# Patient Record
Sex: Female | Born: 1955 | Race: White | Hispanic: No | Smoking: Current every day smoker
Health system: Southern US, Community
[De-identification: ages and names within clinical notes are randomized; demographics above are authoritative.]

## PROBLEM LIST (undated history)

## (undated) DIAGNOSIS — I10 Essential (primary) hypertension: Secondary | ICD-10-CM

## (undated) HISTORY — PX: SPINE SURGERY: SHX786

## (undated) HISTORY — PX: BREAST ENHANCEMENT SURGERY: SHX7

---

## 1999-05-22 ENCOUNTER — Other Ambulatory Visit: Admission: RE | Admit: 1999-05-22 | Discharge: 1999-05-22 | Payer: Self-pay | Admitting: Obstetrics and Gynecology

## 2000-05-04 ENCOUNTER — Other Ambulatory Visit: Admission: RE | Admit: 2000-05-04 | Discharge: 2000-05-04 | Payer: Self-pay | Admitting: Gynecology

## 2000-05-05 ENCOUNTER — Emergency Department (HOSPITAL_COMMUNITY): Admission: EM | Admit: 2000-05-05 | Discharge: 2000-05-05 | Payer: Self-pay | Admitting: Internal Medicine

## 2001-07-12 ENCOUNTER — Other Ambulatory Visit: Admission: RE | Admit: 2001-07-12 | Discharge: 2001-07-12 | Payer: Self-pay | Admitting: Obstetrics and Gynecology

## 2003-07-03 ENCOUNTER — Other Ambulatory Visit: Admission: RE | Admit: 2003-07-03 | Discharge: 2003-07-03 | Payer: Self-pay | Admitting: Obstetrics and Gynecology

## 2004-07-18 ENCOUNTER — Ambulatory Visit: Payer: Self-pay | Admitting: Pulmonary Disease

## 2004-07-18 ENCOUNTER — Ambulatory Visit (HOSPITAL_COMMUNITY): Admission: RE | Admit: 2004-07-18 | Discharge: 2004-07-18 | Payer: Self-pay | Admitting: Pulmonary Disease

## 2004-07-24 ENCOUNTER — Other Ambulatory Visit: Admission: RE | Admit: 2004-07-24 | Discharge: 2004-07-24 | Payer: Self-pay | Admitting: Obstetrics and Gynecology

## 2004-07-31 ENCOUNTER — Ambulatory Visit: Payer: Self-pay | Admitting: Cardiology

## 2004-08-01 ENCOUNTER — Ambulatory Visit (HOSPITAL_COMMUNITY): Admission: RE | Admit: 2004-08-01 | Discharge: 2004-08-01 | Payer: Self-pay | Admitting: Cardiology

## 2004-08-01 ENCOUNTER — Ambulatory Visit: Payer: Self-pay | Admitting: *Deleted

## 2004-08-07 ENCOUNTER — Ambulatory Visit: Payer: Self-pay

## 2004-08-13 ENCOUNTER — Ambulatory Visit: Payer: Self-pay | Admitting: *Deleted

## 2004-09-25 ENCOUNTER — Ambulatory Visit: Payer: Self-pay | Admitting: Cardiology

## 2004-09-29 ENCOUNTER — Ambulatory Visit: Payer: Self-pay | Admitting: Cardiology

## 2004-10-01 ENCOUNTER — Ambulatory Visit: Payer: Self-pay | Admitting: Cardiology

## 2004-10-01 ENCOUNTER — Ambulatory Visit: Payer: Self-pay

## 2004-10-03 ENCOUNTER — Ambulatory Visit: Payer: Self-pay

## 2004-10-15 ENCOUNTER — Ambulatory Visit: Payer: Self-pay | Admitting: Pulmonary Disease

## 2005-02-10 ENCOUNTER — Ambulatory Visit: Payer: Self-pay | Admitting: Cardiology

## 2005-08-04 ENCOUNTER — Other Ambulatory Visit: Admission: RE | Admit: 2005-08-04 | Discharge: 2005-08-04 | Payer: Self-pay | Admitting: Obstetrics and Gynecology

## 2005-08-21 ENCOUNTER — Ambulatory Visit (HOSPITAL_COMMUNITY): Admission: RE | Admit: 2005-08-21 | Discharge: 2005-08-21 | Payer: Self-pay | Admitting: Pulmonary Disease

## 2006-09-13 ENCOUNTER — Other Ambulatory Visit: Admission: RE | Admit: 2006-09-13 | Discharge: 2006-09-13 | Payer: Self-pay | Admitting: Obstetrics and Gynecology

## 2006-09-17 ENCOUNTER — Ambulatory Visit: Payer: Self-pay | Admitting: Family Medicine

## 2006-10-13 ENCOUNTER — Ambulatory Visit: Payer: Self-pay | Admitting: Pulmonary Disease

## 2006-10-13 LAB — CONVERTED CEMR LAB
ALT: 16 units/L (ref 0–40)
AST: 18 units/L (ref 0–37)
Albumin: 4.5 g/dL (ref 3.5–5.2)
Alkaline Phosphatase: 115 units/L (ref 39–117)
BUN: 8 mg/dL (ref 6–23)
Basophils Absolute: 0 10*3/uL (ref 0.0–0.1)
Basophils Relative: 0.5 % (ref 0.0–1.0)
Bilirubin Urine: NEGATIVE
Bilirubin, Direct: 0.1 mg/dL (ref 0.0–0.3)
CO2: 32 meq/L (ref 19–32)
Calcium: 9.7 mg/dL (ref 8.4–10.5)
Chloride: 104 meq/L (ref 96–112)
Cholesterol: 158 mg/dL (ref 0–200)
Creatinine, Ser: 0.8 mg/dL (ref 0.4–1.2)
Eosinophils Absolute: 0.1 10*3/uL (ref 0.0–0.6)
Eosinophils Relative: 1.4 % (ref 0.0–5.0)
GFR calc Af Amer: 98 mL/min
GFR calc non Af Amer: 81 mL/min
Glucose, Bld: 94 mg/dL (ref 70–99)
HCT: 42.9 % (ref 36.0–46.0)
HDL: 54.4 mg/dL (ref >39.0)
Hemoglobin, Urine: NEGATIVE
Hemoglobin: 14.5 g/dL (ref 12.0–15.0)
Ketones, ur: NEGATIVE mg/dL
LDL Cholesterol: 89 mg/dL (ref 0–99)
Leukocytes, UA: NEGATIVE
Lymphocytes Relative: 26.6 % (ref 12.0–46.0)
MCHC: 33.8 g/dL (ref 30.0–36.0)
MCV: 90.9 fL (ref 78.0–100.0)
Monocytes Absolute: 0.5 10*3/uL (ref 0.2–0.7)
Monocytes Relative: 5.6 % (ref 3.0–11.0)
Neutro Abs: 5.5 10*3/uL (ref 1.4–7.7)
Neutrophils Relative %: 65.9 % (ref 43.0–77.0)
Nitrite: NEGATIVE
Platelets: 255 10*3/uL (ref 150–400)
Potassium: 4.8 meq/L (ref 3.5–5.1)
RBC: 4.72 M/uL (ref 3.87–5.11)
RDW: 13.3 % (ref 11.5–14.6)
Sodium: 142 meq/L (ref 135–145)
Specific Gravity, Urine: 1.01 (ref 1.000–1.03)
TSH: 2.58 microintl units/mL (ref 0.35–5.50)
Total Bilirubin: 1 mg/dL (ref 0.3–1.2)
Total CHOL/HDL Ratio: 2.9
Total Protein, Urine: NEGATIVE mg/dL
Total Protein: 7.5 g/dL (ref 6.0–8.3)
Triglycerides: 73 mg/dL (ref 0–149)
Urine Glucose: NEGATIVE mg/dL
Urobilinogen, UA: 0.2 (ref 0.0–1.0)
VLDL: 15 mg/dL (ref 0–40)
Vit D, 1,25-Dihydroxy: 44 (ref 20–57)
WBC: 8.3 10*3/uL (ref 4.5–10.5)
pH: 7 (ref 5.0–8.0)

## 2007-01-26 ENCOUNTER — Ambulatory Visit: Payer: Self-pay | Admitting: Pulmonary Disease

## 2007-01-27 ENCOUNTER — Ambulatory Visit: Payer: Self-pay | Admitting: Cardiovascular Disease

## 2007-01-27 ENCOUNTER — Ambulatory Visit: Payer: Self-pay | Admitting: Pulmonary Disease

## 2007-01-27 LAB — CONVERTED CEMR LAB
BUN: 15 mg/dL (ref 6–23)
Basophils Absolute: 0 10*3/uL (ref 0.0–0.1)
Basophils Relative: 0.5 % (ref 0.0–1.0)
CO2: 28 meq/L (ref 19–32)
Calcium: 9.2 mg/dL (ref 8.4–10.5)
Chloride: 105 meq/L (ref 96–112)
Creatinine, Ser: 0.9 mg/dL (ref 0.4–1.2)
Eosinophils Absolute: 0.2 10*3/uL (ref 0.0–0.6)
Eosinophils Relative: 2.4 % (ref 0.0–5.0)
GFR calc Af Amer: 86 mL/min
GFR calc non Af Amer: 71 mL/min
Glucose, Bld: 91 mg/dL (ref 70–99)
HCT: 40.6 % (ref 36.0–46.0)
Hemoglobin: 14.2 g/dL (ref 12.0–15.0)
Lymphocytes Relative: 29 % (ref 12.0–46.0)
MCHC: 34.9 g/dL (ref 30.0–36.0)
MCV: 91.5 fL (ref 78.0–100.0)
Monocytes Absolute: 0.5 10*3/uL (ref 0.2–0.7)
Monocytes Relative: 6.4 % (ref 3.0–11.0)
Neutro Abs: 5.3 10*3/uL (ref 1.4–7.7)
Neutrophils Relative %: 61.7 % (ref 43.0–77.0)
Platelets: 220 10*3/uL (ref 150–400)
Potassium: 3.8 meq/L (ref 3.5–5.1)
RBC: 4.44 M/uL (ref 3.87–5.11)
RDW: 13.6 % (ref 11.5–14.6)
Sed Rate: 5 mm/hr (ref 0–25)
Sodium: 139 meq/L (ref 135–145)
WBC: 8.4 10*3/uL (ref 4.5–10.5)

## 2007-07-13 ENCOUNTER — Ambulatory Visit: Payer: Self-pay | Admitting: Pulmonary Disease

## 2007-08-12 ENCOUNTER — Ambulatory Visit: Payer: Self-pay | Admitting: Pulmonary Disease

## 2007-08-12 DIAGNOSIS — F411 Generalized anxiety disorder: Secondary | ICD-10-CM | POA: Insufficient documentation

## 2007-08-12 DIAGNOSIS — F329 Major depressive disorder, single episode, unspecified: Secondary | ICD-10-CM | POA: Insufficient documentation

## 2007-08-12 DIAGNOSIS — M1990 Unspecified osteoarthritis, unspecified site: Secondary | ICD-10-CM | POA: Insufficient documentation

## 2007-08-12 DIAGNOSIS — M810 Age-related osteoporosis without current pathological fracture: Secondary | ICD-10-CM | POA: Insufficient documentation

## 2007-08-13 DIAGNOSIS — K649 Unspecified hemorrhoids: Secondary | ICD-10-CM | POA: Insufficient documentation

## 2008-09-12 ENCOUNTER — Ambulatory Visit: Payer: Self-pay | Admitting: Pulmonary Disease

## 2008-09-12 DIAGNOSIS — R51 Headache: Secondary | ICD-10-CM | POA: Insufficient documentation

## 2008-09-12 DIAGNOSIS — K219 Gastro-esophageal reflux disease without esophagitis: Secondary | ICD-10-CM | POA: Insufficient documentation

## 2008-09-12 DIAGNOSIS — R519 Headache, unspecified: Secondary | ICD-10-CM | POA: Insufficient documentation

## 2008-09-13 DIAGNOSIS — R030 Elevated blood-pressure reading, without diagnosis of hypertension: Secondary | ICD-10-CM | POA: Insufficient documentation

## 2008-09-13 LAB — CONVERTED CEMR LAB
ALT: 21 units/L (ref 0–35)
AST: 21 units/L (ref 0–37)
Albumin: 4.2 g/dL (ref 3.5–5.2)
Alkaline Phosphatase: 103 units/L (ref 39–117)
BUN: 9 mg/dL (ref 6–23)
Basophils Absolute: 0.1 10*3/uL (ref 0.0–0.1)
Basophils Relative: 0.7 % (ref 0.0–3.0)
Bilirubin, Direct: 0.1 mg/dL (ref 0.0–0.3)
CO2: 29 meq/L (ref 19–32)
Calcium: 9 mg/dL (ref 8.4–10.5)
Chloride: 107 meq/L (ref 96–112)
Creatinine, Ser: 0.8 mg/dL (ref 0.4–1.2)
Eosinophils Absolute: 0.1 10*3/uL (ref 0.0–0.7)
Eosinophils Relative: 1.2 % (ref 0.0–5.0)
GFR calc Af Amer: 98 mL/min
GFR calc non Af Amer: 81 mL/min
Glucose, Bld: 97 mg/dL (ref 70–99)
HCT: 40.9 % (ref 36.0–46.0)
Hemoglobin: 14.2 g/dL (ref 12.0–15.0)
Lymphocytes Relative: 25.1 % (ref 12.0–46.0)
MCHC: 34.7 g/dL (ref 30.0–36.0)
MCV: 92.4 fL (ref 78.0–100.0)
Monocytes Absolute: 0.5 10*3/uL (ref 0.1–1.0)
Monocytes Relative: 5.7 % (ref 3.0–12.0)
Neutro Abs: 5.2 10*3/uL (ref 1.4–7.7)
Neutrophils Relative %: 67.3 % (ref 43.0–77.0)
Platelets: 196 10*3/uL (ref 150–400)
Potassium: 3.7 meq/L (ref 3.5–5.1)
RBC: 4.43 M/uL (ref 3.87–5.11)
RDW: 13.1 % (ref 11.5–14.6)
Sed Rate: 8 mm/hr (ref 0–22)
Sodium: 141 meq/L (ref 135–145)
TSH: 2.46 microintl units/mL (ref 0.35–5.50)
Total Bilirubin: 0.8 mg/dL (ref 0.3–1.2)
Total Protein: 6.8 g/dL (ref 6.0–8.3)
WBC: 7.9 10*3/uL (ref 4.5–10.5)

## 2009-03-12 ENCOUNTER — Encounter: Payer: Self-pay | Admitting: Gastroenterology

## 2009-03-12 LAB — CONVERTED CEMR LAB
Basophils Absolute: 0 10*3/uL (ref 0.0–0.1)
Basophils Relative: 0.6 % (ref 0.0–3.0)
Eosinophils Absolute: 0.2 10*3/uL (ref 0.0–0.7)
Eosinophils Relative: 2.5 % (ref 0.0–5.0)
HCT: 38.1 % (ref 36.0–46.0)
Hemoglobin: 13.4 g/dL (ref 12.0–15.0)
Lymphocytes Relative: 28 % (ref 12.0–46.0)
Lymphs Abs: 2 10*3/uL (ref 0.7–4.0)
MCHC: 35.3 g/dL (ref 30.0–36.0)
MCV: 88.9 fL (ref 78.0–100.0)
Monocytes Absolute: 0.5 10*3/uL (ref 0.1–1.0)
Monocytes Relative: 7.3 % (ref 3.0–12.0)
Neutro Abs: 4.4 10*3/uL (ref 1.4–7.7)
Neutrophils Relative %: 61.6 % (ref 43.0–77.0)
Platelets: 322 10*3/uL (ref 150.0–400.0)
RBC: 4.29 M/uL (ref 3.87–5.11)
RDW: 12.3 % (ref 11.5–14.6)
WBC: 7.1 10*3/uL (ref 4.5–10.5)

## 2009-03-14 ENCOUNTER — Encounter: Payer: Self-pay | Admitting: Gastroenterology

## 2009-03-14 LAB — CONVERTED CEMR LAB
Basophils Absolute: 0.1 K/uL
Basophils Relative: 1.5 %
Eosinophils Absolute: 0.3 K/uL
Eosinophils Relative: 5.2 % — ABNORMAL HIGH
HCT: 39.1 %
Hemoglobin: 13.5 g/dL
Lymphocytes Relative: 41.4 %
Lymphs Abs: 2.1 K/uL
MCHC: 34.5 g/dL
MCV: 93.3 fL
Monocytes Absolute: 0.5 K/uL
Monocytes Relative: 9.4 %
Neutro Abs: 2.2 K/uL
Neutrophils Relative %: 42.5 % — ABNORMAL LOW
Platelets: 238 K/uL
RBC: 4.19 M/uL
RDW: 12.5 %
WBC: 5.2 10*3/microliter

## 2009-05-06 ENCOUNTER — Ambulatory Visit: Payer: Self-pay | Admitting: Pulmonary Disease

## 2009-05-06 ENCOUNTER — Encounter: Payer: Self-pay | Admitting: Adult Health

## 2009-05-07 ENCOUNTER — Encounter: Payer: Self-pay | Admitting: Pulmonary Disease

## 2009-05-11 ENCOUNTER — Ambulatory Visit (HOSPITAL_COMMUNITY): Admission: RE | Admit: 2009-05-11 | Discharge: 2009-05-11 | Payer: Self-pay | Admitting: Specialist

## 2009-05-21 ENCOUNTER — Encounter: Payer: Self-pay | Admitting: Pulmonary Disease

## 2009-05-22 ENCOUNTER — Ambulatory Visit (HOSPITAL_COMMUNITY): Admission: RE | Admit: 2009-05-22 | Discharge: 2009-05-23 | Payer: Self-pay | Admitting: Specialist

## 2009-09-19 ENCOUNTER — Ambulatory Visit: Payer: Self-pay | Admitting: Pulmonary Disease

## 2009-09-19 ENCOUNTER — Telehealth: Payer: Self-pay | Admitting: Pulmonary Disease

## 2009-09-19 DIAGNOSIS — F17200 Nicotine dependence, unspecified, uncomplicated: Secondary | ICD-10-CM | POA: Insufficient documentation

## 2009-09-19 DIAGNOSIS — J441 Chronic obstructive pulmonary disease with (acute) exacerbation: Secondary | ICD-10-CM | POA: Insufficient documentation

## 2009-09-19 DIAGNOSIS — M545 Low back pain, unspecified: Secondary | ICD-10-CM | POA: Insufficient documentation

## 2009-10-14 ENCOUNTER — Ambulatory Visit: Payer: Self-pay | Admitting: Pulmonary Disease

## 2009-10-18 ENCOUNTER — Telehealth (INDEPENDENT_AMBULATORY_CARE_PROVIDER_SITE_OTHER): Payer: Self-pay | Admitting: *Deleted

## 2010-09-25 NOTE — Assessment & Plan Note (Signed)
Summary: 3 week follow up with spiro/la   CC:  3 week ROV....  History of Present Illness: [name] here for an add-on appt due to cough & dyspnea...   ~  Jan11:   I have not seen her in several years, she works in the [employer]... presents w/  ~1week hx cough- dry, no sputum but congested, w/ wheezing & SOB... she is a 1ppd smoker... denies f/ c/ s, but has had some CP- mostly due to the coughing she says... also notes some nasal/ head congestion & drainage... exam w/ bilat L>R rhonchi/ wheezing; & CXR w/ COPD & faint LLL opacity... we discussed Rx w/ Zithromax, Prednisone, Mucinex + Fluids, & cough syrup Prn... plan short term f/u to consider AB regimen & smoking cessation counselling...   ~  October 14, 2009:  She is improved- cough, discomfort, dyspnea- all resolved... exam now clear;  PFT w/ FEV1= 1.53 (53%) but similar to 2008... she is still smoking & can't vs won't quit... we discussed adding Symbicort 80- 2spBid regularly & she knows that she needs to quit entirely...    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  ~  Hx resp failure, ARDS- 1998 w/ staph pneumonia ([place])...  ~  PFTs 2/08 showed FVC= 2.67, FEV1= 1.52, %1sec= 57, mid-flows= 27%... rec to start Spiriva, & stop smoking- but she didn't do either...  ~  CT Angio Chest 6/08 showed no PE, mod centrilob emphysema, ?collateral vessels about the left breast suggesting a component of central venous insuffic at the left subclav vein...  ~  1/11: presents w/ infectious exac req Zithromax, Prednisone, Mucinex, Fluids, etc> resolved.  ~  2/11: PFT's showed FVC=2.22 (61%), FEV1=1.53 (53%), %1sec=69, mid-flows=35%pred... we decided to start SYMBICORT 80-2spBid regularly as she had a reaction to Spiriva in past.  CIGARETTE SMOKER (ICD-305.1) - smokes 1ppd by her hx and can't vs won't quit... we discuused  smoking cessation strategies but she is not interested...  ELEVATED BLOOD PRESSURE (ICD-796.2) - hx transient elevated BP due to stress... advised stress reduction, low sodium, Ativan Prn...   ~  Cardiolite 5/03 showed no ischemia, no infarct, EF= 58%, +breast attenuation...  ~  Cath by DrStuckey in 2005 showed min CAD- no signif CAD, ?spasm...  ~  2DEcho 2/06 showed norm LV wall thickness, norm LVF, norm wall motion...  ~  2/11:  BP today = 116/82 & feeling OK...  GERD (ICD-530.81) - prev on Nexium, now using PRILOSEC 20mg  Prn OTC...  HEMORRHOIDS (ICD-455.6)  OSTEOARTHRITIS (ICD-715.90) - she uses Prn OTC NSAIDs...  BACK PAIN, LUMBAR (ICD-724.2) - eval 9/10 by DrBeane w/ XRays & MRI showing disk herniation compressing the L5 S1 nerve roots... she had an L4-5 decompression w/ foraminotomy & microdiskectomy 9/10 by drBeane... improved post op but some neuropathic symptoms treated w/ LYRICA 50mg  Qhs...  OSTEOPOROSIS (ICD-733.00) - followed by DrFontaine et al... prev on FOSAMAX, Calcium, MVI...  ~  BMD 2008 showed TScores -2.0 in Spine, & -3.4 in left FemNeck (worse than 2001)...  ~  labs 2/08 showed Vit D level = 44  HEADACHE (ICD-784.0)  ANXIETY (ICD-300.00) - given Ativan by DrBeane perioperatively to help w/ smoking cessation... she tried Xanax in the past but didn't like how it made her feel...  DEPRESSION (ICD-311) - tried Cymbalta in the past but didn't like how it made her feel...   Allergies (  verified): No Known Drug Allergies  Comments:  Nurse/Medical Assistant: The patient's medications and allergies were reviewed with the patient and were updated in the Medication and Allergy Lists.  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 emphysema - father heart disease - mother, father (CABG) rheumatism - father hypercholesterolemia - mother, father  Social History: Reviewed history from 05/06/2009 and no changes required. current smoker, 1ppd x41yrs. 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  The patient denies anorexia, fever, weight loss, weight gain, vision loss, decreased hearing, hoarseness, chest pain, syncope, peripheral edema, prolonged cough, headaches, hemoptysis, abdominal pain, melena, hematochezia, severe indigestion/heartburn, hematuria, incontinence, muscle weakness, suspicious skin lesions, transient blindness, difficulty walking, depression, unusual weight change, abnormal bleeding, enlarged lymph nodes, and angioedema.    Vital Signs:  Patient profile:   55 year old female Height:      65 inches Weight:      130 pounds O2 Sat:      99 % on Room air Temp:     97.3 degrees F oral Pulse rate:   73 / minute BP sitting:   116 / 82  (right arm) Cuff size:   regular  Vitals Entered By: Randell Loop CMA (October 14, 2009 10:32 AM)  O2 Sat at Rest %:  99 O2 Flow:  Room air CC: 3 week ROV... Is Patient Diabetic? No Pain Assessment Patient in pain? no      Comments meds updated today   Physical Exam  Additional Exam:  WD, Thin, [name] in NAD... GENERAL:  Alert & oriented; pleasant & cooperative... HEENT:  [place]/AT, EOM-wnl, PERRLA, EACs-clear, TMs-wnl, NOSE-clear, THROAT-clear & wnl. NECK:  Supple w/ fairROM; no JVD; normal carotid impulses w/o bruits; no thyromegaly or nodules palpated; no lymphadenopathy. CHEST:  Chest  clear now w/o wheezies, rales or signs of consolidation... HEART:  Regular Rhythm; without murmurs/ rubs/ or gallops heard...  ABDOMEN:  Soft & nontender; normal bowel sounds; no organomegaly or masses detected. EXT: without deformities, mild arthritic changes; no varicose veins/ venous insuffic/ or edema. NEURO:  CN's intact; motor testing normal; sensory testing normal; gait normal & balance OK. DERM:  No lesions noted; no rash etc...    Pulmonary Function Test Date: 10/14/2009 10:48 AM Gender: Female  Pre-Spirometry FVC    Value: 2.22 L/min   Pred: 3.64 L/min     % Pred: 61 % FEV1    Value: 1.53 L     Pred: 2.87 L     % Pred: 53 % FEV1/FVC  Value: 69 %     Pred: 80 %     % Pred: -- % FEF 25-75  Value: 0.98 L/min   Pred: 2.78 L/min     % Pred: 35 %  Comments: Tracings are fair- there is mild to mod airflow obstruction... cannot r/o superimposed restriction w/o lung volume measurement...  SN  Impression & Recommendations:  Problem # 1:  LLL PNEUMONIA>>> Clinically resolved & back toher baseline...  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 reviewed PFT w/ pt> rec start SYMBICORT 80- 2spBid... Orders: Spirometry w/Graph (94010)  Problem # 3:  CIGARETTE SMOKER (ICD-305.1) We discussed smoking cessation strategies including cessation programs, counselling, nicotine replacement, and Chantix receptor blockade... the pt is not interested at this time but we left the door open should she like to reconsider at any time.   Problem # 4:  OTHER MEDICAL PROBLEMS AS NOTED>>>  Complete Medication List: 1)  Mucinex Maximum Strength 1200 Mg Xr12h-tab (Guaifenesin) .... Take 1 tab by mouth two times a day w/ fluids.Marland KitchenMarland Kitchen 2)  Aspirin 81 Mg Tbec (Aspirin) .... Take 1 by mouth once daily 3)  Nexium 40 Mg Cpdr (Esomeprazole magnesium) .... Take one tablet by mouth once daily 4)  Ibuprofen 200 Mg Tabs (Ibuprofen) .... As needed 5)   Lyrica 50 Mg Caps (Pregabalin) .... Take one tablet by mouth at bedtime 6)  Promethazine-codeine 6.25-10 Mg/77ml Syrp (Promethazine-codeine) .Marland Kitchen.. 1 tsp by mouth every 4-6h as needed for cough... 7)  Symbicort 80-4.5 Mcg/act Aero (Budesonide-formoterol fumarate) .... 2 inhalations two times a day  Other Orders: Prescription Created Electronically [phone])  Patient Instructions: 1)  Today we updated your med list- see below.... 2)  We added SYMBICORT 2 inhalations two times a day for your COPD.Marland KitchenMarland Kitchen 3)  Miranda [name]- you must quit smoking!!! Let me know if you want to try the Chantix Rx.Marland KitchenMarland Kitchen 4)  Call for any problems.Marland KitchenMarland Kitchen 5)  Let's plan a routine follow up in 1 yr, sooner as needed. 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

## 2010-09-25 NOTE — Assessment & Plan Note (Signed)
Summary: SOB, congestion, not seen since 2008///JJ   CC:  Add-on appt for cough and & dyspnea....  History of Present Illness: [name] here for an add-on appt due to cough & dyspnea... I have not seen her in several years, she works in the [employer]... presents w/  ~1week hx cough- dry, no sputum but congested, w/ wheezing & SOB... she is a 1ppd smoker... denies f/ c/ s, but has had some CP- mostly due to the coughing she says... also notes some nasal/ head congestion & drainage...       ** Eval shows exam w/ bilat L>R rhonchi/ wheezing; & CXR w/ COPD & faint LLL opacity... we discussed Rx w/ Zithromax, Prednisone, Mucinex + Fluids, & cough syrup Prn... plan short term f/u to consider AB regimen & smoking cessation counselling...   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  ~  PFTs 2/08 showed FVC= 2.67, FEV1= 1.52, %1sec= 57, mid-flows= 27%... rec to start Spiriva, & stop smoking- but she didn't do either...  ~  CT [place] Chest 6/08 showed no PE, mod centrilob emphysema, ?collateral vessels about the left breast suggesting a component of central venous insuffic at the left subclav vein...  ~  1/11: presents w/ infectious exac req Zithromax, Prednisone, Mucinex, Fluids, etc...  CIGARETTE SMOKER (ICD-305.1) - smokes 1ppd by her hx and can't vs won't quit...  ELEVATED BLOOD PRESSURE (ICD-796.2) - hx transient elevated BP due to stress... advised stress reduction, low sodium, Ativan Prn...  ~  Cardiolite 5/03 showed no ischemia, no infarct, EF= 58%, +breast attenuation...  ~  Cath by DrStuckey in 2005 showed min CAD- no signif CAD, ?spasm...  ~  2DEcho 2/06 showed norm LV wall thickness, norm LVF, norm wall motion...  GERD (ICD-530.81) - prev on Nexium, now using PRILOSEC 20mg  Prn OTC...  HEMORRHOIDS (ICD-455.6)  OSTEOARTHRITIS (ICD-715.90) - she uses Prn OTC NSAIDs...  BACK  PAIN, LUMBAR (ICD-724.2) - eval 9/10 by DrBeane w/ XRays & MRI showing disk herniation compressing the L5 S1 nerve roots... she had an L4-5 decompression w/ foraminotomy & microdiskectomy 9/10 by drBeane... improved post op but some neuropathic symptoms treated w/ LYRICA 50mg  Qhs...  OSTEOPOROSIS (ICD-733.00) - followed by DrFontaine et al... prev on FOSAMAX, Calcium, MVI...  ~  BMD 2008 showed TScores -2.0 in Spine, & -3.4 in left FemNeck (worse than 2001)...  ~  labs 2/08 showed Vit D level = 44  HEADACHE (ICD-784.0)  ANXIETY (ICD-300.00) - given Ativan by DrBeane perioperatively to help w/ smoking cessation... she tried Xanax in the past but didn't like how it made her feel...  DEPRESSION (ICD-311) - tried Cymbalta in the past but didn't like how it made her feel...    Allergies (verified): No Known Drug Allergies  Comments:  Nurse/Medical Assistant: The patient's medications and allergies were reviewed with the patient and were updated in the Medication and Allergy Lists.  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 emphysema - father heart disease - mother, father (CABG) rheumatism - father hypercholesterolemia - mother, father  Social History: Reviewed history from 05/06/2009 and no changes  required. current smoker, 1ppd x81yrs. 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  The patient denies anorexia, fever, weight loss, weight gain, vision loss, decreased hearing, chest pain, syncope, peripheral edema, headaches, hemoptysis, abdominal pain, melena, hematochezia, severe indigestion/heartburn, hematuria, incontinence, muscle weakness, suspicious skin lesions, transient blindness, difficulty walking, depression, unusual weight change, abnormal bleeding, enlarged lymph nodes, and angioedema.    Vital Signs:  Patient profile:   55 year old female Height:      65 inches Weight:      129.13 pounds BMI:     21.57 O2 Sat:      95 % on Room air Temp:     97.6 degrees F oral Pulse rate:   94 / minute BP sitting:   120 / 90  (left arm) Cuff size:   regular  Vitals Entered By: Randell Loop CMA (September 19, 2009 11:17 AM)  O2 Sat at Rest %:  95 O2 Flow:  Room air CC: Add-on appt for cough, & dyspnea... Comments meds updated today   Physical Exam  Additional Exam:  WD, Thin, [name] in NAD... GENERAL:  Alert & oriented; pleasant & cooperative... HEENT:  [place]/AT, EOM-wnl, PERRLA, EACs-clear, TMs-wnl, NOSE-clear, THROAT-clear & wnl. NECK:  Supple w/ fairROM; no JVD; normal carotid impulses w/o bruits; no thyromegaly or nodules palpated; no lymphadenopathy. CHEST:  Scat rhonchi w/ exp wheezing, no rales or signs of consolidation... HEART:  Regular Rhythm; without murmurs/ rubs/ or gallops heard...  ABDOMEN:  Soft & nontender; normal bowel sounds; no organomegaly or masses detected. EXT: without deformities, mild arthritic changes; no varicose veins/ venous insuffic/ or edema. NEURO:  CN's intact; motor testing normal; sensory testing normal; gait normal & balance OK. DERM:  No lesions noted; no rash etc...     CXR  Procedure date:  09/19/2009  Findings:      CHEST - 2 VIEW   Comparison: 05/22/2009   Findings: Bilateral breast implants. Midline trachea.  Normal heart size and mediastinal contours. No pleural  effusion or pneumothorax. Basilar segment left lower lobe airspace disease.   IMPRESSION:   1.  Left lower lobe airspace disease.  Although this could represent atelectasis, given the clinical history, is suspicious for infection.  Recommend radiographic follow-up until clearing. 2.  Underlying COPD/chronic bronchitis.   Read By:  Consuello Bossier,  M.D.      Comments:      I have personally reviewed this XRay on the [employer] system- SN    CXR  Procedure date:  09/19/2009  Findings:      CHEST - 2 VIEW   Comparison: 05/22/2009   Findings: Bilateral breast implants. Midline trachea.  Normal heart size and mediastinal contours. No pleural effusion or pneumothorax. Basilar segment left lower lobe airspace disease.   IMPRESSION:   1.  Left lower lobe airspace disease.  Although this could represent atelectasis, given the clinical history, is suspicious for infection.  Recommend radiographic follow-up until clearing. 2.  Underlying COPD/chronic bronchitis.  Read By:  Consuello Bossier,  M.D.       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 must quit smoking- Rx w/ ZPak,Pred, Mucinex, etc... Orders: Depo- Medrol 80mg  (  J1040) Admin of Therapeutic Inj  intramuscular or subcutaneous (57322) T-2 View CXR (71020TC)  Problem # 2:  ELEVATED BLOOD PRESSURE (ICD-796.2) Discussed no salt etc...  Problem # 3:  GERD (ICD-530.81) GI stable- continue meds... Her updated medication list for this problem includes:    Nexium 40 Mg Cpdr (Esomeprazole magnesium) .Marland Kitchen... Take one tablet by mouth once daily  Problem # 4:  OSTEOPOROSIS (ICD-733.00) She has signif Osteoporosis... will discuss options on ret OV...  Problem # 5:  OTHER MEDICAL PROBLEMS AS NOTED>>>  Complete Medication List: 1)  Aspirin 81 Mg Tbec (Aspirin) .... Take 1 by mouth once daily 2)  Ibuprofen 200 Mg Tabs (Ibuprofen) .... As  needed 3)  Lyrica 50 Mg Caps (Pregabalin) .... Take one tablet by mouth at bedtime 4)  Zithromax Z-pak 250 Mg Tabs (Azithromycin) .... Take as directed... 5)  Prednisone (pak) 10 Mg Tabs (Prednisone) .... Take as directed... 6)  Mucinex Maximum Strength 1200 Mg Xr12h-tab (Guaifenesin) .... Take 1 tab by mouth two times a day w/ fluids.Marland KitchenMarland Kitchen 7)  Promethazine-codeine 6.25-10 Mg/35ml Syrp (Promethazine-codeine) .Marland Kitchen.. 1 tsp by mouth every 4-6h as needed for cough... 8)  Nexium 40 Mg Cpdr (Esomeprazole magnesium) .... Take one tablet by mouth once daily  Other Orders: Prescription Created Electronically [phone])  Patient Instructions: 1)  Today we updated your med list- see below.... 2)  For your Asthmatic Bronchitis:  1) you must cut back & quit smoking...  2) take the ZPak antibiotic.Marland KitchenMarland Kitchen  3) we gave you a Depo shot & started a Prednisone dosepak for 1 week... 4) start the MUCINEX MAX OTC- 1,200mg  two times a day w/ fluids.Marland KitchenMarland Kitchen  4) you may use the cough syrup as needed... 3)  Today we did a follow up CXR-  check the phone tree for your results.Marland KitchenMarland Kitchen 4)  Call for any worsening symptoms.., 5)  Let's plan a follow up visit in about 3 weeks to check your PFT's and consider any longer term meds that can help... Prescriptions: PROMETHAZINE-CODEINE 6.25-10 MG/5ML SYRP (PROMETHAZINE-CODEINE) 1 tsp by mouth every 4-6H as needed for cough...  #8 oz x 1   Entered and Authorized by:   Michele Mcalpine MD   Signed by:   Michele Mcalpine MD on 09/19/2009   Method used:   Print then Give to Patient   RxID:   [id number] PREDNISONE (PAK) 10 MG TABS (PREDNISONE) take as directed...  #1 pack x 0   Entered and Authorized by:   Michele Mcalpine MD   Signed by:   Michele Mcalpine MD on 09/19/2009   Method used:   Print then Give to Patient   RxID:   [id number] ZITHROMAX Z-PAK 250 MG TABS (AZITHROMYCIN) take as directed...  #1 pack x 0   Entered and Authorized by:   Michele Mcalpine MD   Signed by:   Michele Mcalpine MD on  09/19/2009   Method used:   Print then Give to Patient   RxID:   [id number]    Immunization History:  Influenza Immunization History:    Influenza:  historical (06/04/2009)    Medication Administration  Injection # 1:    Medication: Depo- Medrol 80mg     Diagnosis: OBSTRUCTIVE CHRONIC BRONCHITIS WITH EXACERBATION (ICD-491.21)    Route: IM    Site: RUOQ gluteus    Exp Date: 05-2010    Lot #: [id number]    Mfr: teva    Patient tolerated injection without complications    Given by: Randell Loop  CMA (September 19, 2009 12:20 PM)  Orders Added: 1)  Prescription Created Electronically [[id number]] 2)  Est. Patient Level IV [81191] 3)  Depo- Medrol 80mg  [J1040] 4)  Admin of Therapeutic Inj  intramuscular or subcutaneous [96372] 5)  T-2 View CXR [71020TC]

## 2010-09-25 NOTE — Progress Notes (Signed)
Summary: lyrica samples  Phone Note Call from Patient Call back at Work Phone [phone]   Caller: Patient Call For: Dr. Kriste Basque Summary of Call: pt request samples of lyrica.  however, we only have samples of the 75mg .  pt would like to know if she may samples of this strength.  please advise, thanks! Initial call taken by: Boone Master CNA,  October 18, 2009 11:35 AM  Follow-up for Phone Call        pt given samples of the lyrica 75mg   ok per SN Randell Loop CMA  October 18, 2009 12:13 PM

## 2010-09-25 NOTE — Progress Notes (Signed)
Summary: rx  Phone Note From Pharmacy Call back at [phone]   Caller: [facility] Call For: Tammy P  Summary of Call: Prednisone, need # days supply. Initial call taken by: Eugene Gavia,  September 19, 2009 1:29 PM  Follow-up for Phone Call        per SN pt needs 6 day pak. pharmacy notified. Carron Curie CMA  September 19, 2009 2:05 PM

## 2010-11-28 LAB — PROTIME-INR
INR: 1 (ref 0.00–1.49)
Prothrombin Time: 12.9 seconds (ref 11.6–15.2)

## 2010-11-28 LAB — CBC
HCT: 44.5 % (ref 36.0–46.0)
Hemoglobin: 15.2 g/dL — ABNORMAL HIGH (ref 12.0–15.0)
MCHC: 34.1 g/dL (ref 30.0–36.0)
MCV: 92.6 fL (ref 78.0–100.0)
Platelets: 228 10*3/uL (ref 150–400)
RBC: 4.81 MIL/uL (ref 3.87–5.11)
RDW: 14.2 % (ref 11.5–15.5)
WBC: 8.5 10*3/uL (ref 4.0–10.5)

## 2010-11-28 LAB — URINE MICROSCOPIC-ADD ON

## 2010-11-28 LAB — URINALYSIS, ROUTINE W REFLEX MICROSCOPIC
Bilirubin Urine: NEGATIVE
Glucose, UA: NEGATIVE mg/dL
Ketones, ur: NEGATIVE mg/dL
Leukocytes, UA: NEGATIVE
Nitrite: NEGATIVE
Protein, ur: NEGATIVE mg/dL
Specific Gravity, Urine: 1.008 (ref 1.005–1.030)
Urobilinogen, UA: 0.2 mg/dL (ref 0.0–1.0)
pH: 7 (ref 5.0–8.0)

## 2010-11-28 LAB — COMPREHENSIVE METABOLIC PANEL
ALT: 16 U/L (ref 0–35)
AST: 21 U/L (ref 0–37)
Albumin: 5 g/dL (ref 3.5–5.2)
Alkaline Phosphatase: 102 U/L (ref 39–117)
BUN: 11 mg/dL (ref 6–23)
CO2: 26 mEq/L (ref 19–32)
Calcium: 9.7 mg/dL (ref 8.4–10.5)
Chloride: 106 mEq/L (ref 96–112)
Creatinine, Ser: 0.96 mg/dL (ref 0.4–1.2)
GFR calc Af Amer: >60 mL/min (ref >60)
GFR calc non Af Amer: >60 mL/min (ref >60)
Glucose, Bld: 97 mg/dL (ref 70–99)
Potassium: 4.1 mEq/L (ref 3.5–5.1)
Sodium: 140 mEq/L (ref 135–145)
Total Bilirubin: 1 mg/dL (ref 0.3–1.2)
Total Protein: 7.6 g/dL (ref 6.0–8.3)

## 2010-11-28 LAB — APTT: aPTT: 30 seconds (ref 24–37)

## 2011-01-06 NOTE — Assessment & Plan Note (Signed)
[name]                             PULMONARY OFFICE NOTE   Miranda [name], Miranda [name]                  MRN:          [id number]  DATE:07/13/2007                            DOB:          [birth year]    HISTORY OF PRESENT ILLNESS:  Patient is a very pleasant 55 year old  white female patient of Dr. Kriste Basque, who has a known history of  osteoarthritis and osteoporosis.  Patient presents today, complaining  that, over the last several months, she has had increased stress, felt  very agitated at times, feels like that she loses her temper quite  often, especially at work, and feels very tense.  Her mood has been very  labile lately with emotional episodes of crying.  Patient continues to  work two jobs, approximately 60 hours a week.  Three days a week she  works more than 14 hours a day.  Her son is currently at [employer] in  college and patient has some financial stressors.  Patient reports that  she does not do much for her social activities, nor does she exercise.  She does smoke on a daily basis.  Patient reports she has had no  previous episodes of depression, nor taken medicines.  She did have some  episodes of anxiety earlier this year and was prescribed some Xanax.  Patient reports this seemed to make her more agitated when she took it,  so she takes this very rarely.  The patient denies history of abuse.  She denies chest pain, palpitations, abdominal pain, nausea, vomiting.  No shortness of breath.  No suicidal and homicidal ideations.   The patient's lab work, earlier this year, has all been normal,  including CBC, CMET and TSH.  The patient states her father does have a  history of some depression, as well.   PAST MEDICAL HISTORY:  Reviewed.   CURRENT MEDICATIONS:  Reviewed.   PHYSICAL EXAM:  Patient is a pleasant, thin female, in no acute  distress.  She is afebrile with stable vital signs.  HEENT:  Unremarkable.  NECK:  Supple  without cervical adenopathy.  Thyroid is nonpalpable.  LUNG SOUNDS:  Clear.  CARDIAC:  Regular rate.  ABDOMEN:  Soft and nontender.  EXTREMITIES:  Warm without any edema.  SKIN:  Warm without rash.   IMPRESSION AND PLAN:  Anxiety and depression.  In-depth conversation  with patient concerning stress-reducing activities, proper diet, sleep  and exercise.  I have recommended counseling and patient does report  that she will think about this and would like to wait at present time,  before we refer her.  I have recommended we begin Cymbalta 30 mg daily  times two weeks, then 60 mg daily.  Patient is encouraged on positive  reinforcement and decreasing some  of her responsibilities, especially at work.  Patient will return back  here in four weeks or sooner if needed.      Rubye Oaks, NP  Electronically Signed      Lonzo Cloud. Kriste Basque, MD  Electronically Signed   TP/MedQ  DD: 07/13/2007  DT: 07/13/2007  Job #: [id number]

## 2011-01-09 NOTE — Cardiovascular Report (Signed)
NAMETIFFINIE, [name] NO.:  [id number]   MEDICAL RECORD NO.:  [id number]          PATIENT TYPE:  OIB   LOCATION:  2861                         FACILITY:  [facility]   PHYSICIAN:  Carole Binning, M.D. LHCDATE OF BIRTH:  1956-06-12   DATE OF PROCEDURE:  08/01/2004  DATE OF DISCHARGE:  08/01/2004                              CARDIAC CATHETERIZATION   PROCEDURE PERFORMED:  1.  Left heart catheterization with,  2.  Coronary angiography; and,  3.  Left ventriculography.   CARDIOLOGIST:  Carole Binning, M.D.   INDICATIONS:  [name] is a 55 year old woman with a history of what  appears to have been a nonischemic cardiomyopathy.  She has had subsequent  recover of her left ventricular function.  She has been stable clinically.  However, she presented to the office yesterday for routine follow up and was  noted to have significant changes in her EKG with T wave inversions in the  anterolateral leads worrisome for possible ischemia.  She was evaluated by  Dr. Riley Kill and referred for cardiac catheterization.   PROCEDURAL NOTE:  A 6 French sheath was placed in the right femoral artery.  The patient had a short left main coronary artery.  The catheters tended to  subselect her vessels.  We therefore imaged with the left circumflex with a  JL-4 catheter.  The left anterior descending artery was imaged with JL-3.5.  Right coronary artery was imaged with a JR-4 catheter.  Left  ventriculography was performed with an angled pigtail catheter.   CONTRAST MATERIAL:  Contrast was Omnipaque.   COMPLICATIONS:  There were no complications   RESULTS:   HEMODYNAMIC DATA:  Left ventricular pressure 118/14.  Aortic pressure  132/80.  There is no aortic valve gradient.   VENTRICULOGRAPHIC DATA:  Left Ventriculogram:  There is mild-to-moderate  hypokinesis of the apex.  Otherwise wall motion is normal.  Ejection  fraction is calculated at 57%.  There is trace mitral  regurgitation.   ARTERIOGRAPHIC DATA:  Coronary Arteriography  Left Main:  The left main is very short, but normal.   Left Anterior Descending Artery:  The left anterior descending artery gives  rise to a single normal-sized diagonal branch.  The LAD has a 30% stenosis  in the distal vessel at an area where the vessel is somewhat kinked during  systole.  It is otherwise normal giving rise to normal-sized diagonal  branch.   Left Circumflex:  The left circumflex gives rise to a single large obtuse  marginal, which supplies the posterolateral wall.  The circumflex proper and  obtuse marginal are normal.  In addition, there is a small ramus  intermedius, which has what appears to be possible 50% stenosis at its  origin; however, this may be in part catheter-related spasm.  This is a very  small caliber vessel.   Right Coronary Artery:  The right coronary artery is a relatively small  codominant vessel giving rise to a small posterior descending artery and a  normal-sized acute marginal branch, which supplies the distal portion of the  inferior septum. The right coronary  artery is normal.   IMPRESSION:  1.  Left ventricular systolic function in the low range of normal with mild      apical wall abnormality as described.  2.  No significant coronary artery disease identified.   RECOMMENDATIONS:  The recommendations are for medical therapy.      Mark   MWP/MEDQ  D:  08/01/2004  T:  08/02/2004  Job:  [id number]   cc:   Arturo Morton. Riley Kill, M.D. [facility]   Cardiac Catheterization Laboratory

## 2011-01-09 NOTE — H&P (Signed)
NAMEJOCHEBED, BILLS NO.:  [id number]   MEDICAL RECORD NO.:  [id number]          PATIENT TYPE:  OUT   LOCATION:  XRAY                         FACILITY:  [facility]   PHYSICIAN:  Arturo Morton. Riley Kill, M.D. [facility] OF BIRTH:  July 18, 1956   DATE OF ADMISSION:  07/18/2004  DATE OF DISCHARGE:  07/18/2004                                HISTORY & PHYSICAL   CHIEF COMPLAINT:  I feel okay.   HISTORY OF PRESENT ILLNESS:  Ms. Molina is a 55 year old white female whom I  have seen in the clinic previously. She had an episode of rapid palpitations  in 2003. At that time, she was seen at [facility] and had  tachycardia associated with some ST elevation. This was in V1 through V3.  Repeat tracings were similar. She was subsequently seen at [facility] later on.  She had an exercise Cardiolite. She had five minutes on the Bruce protocol.  There was no chest pain and there were no ST segment changes. The test was  stopped secondary to dyspnea. Cardiolite revealed an ejection fraction of  58% and breast attenuation. Of note, the patient has had breast  augmentation. A 2-D echo done at that time also revealed an ejection  fraction of 50%, an aneurysmal atrial septum with __________ MR and TR. When  she was admitted, she had ventilator-dependent pneumonia in 1998 at [facility] and she had an ejection fraction at that time of about 20%. It  gradually increased. She had significant caffeine consumption in the past  and she subsequently stopped that. The patient has had some recurrent  bronchitis in the past. She came today to the office for a follow-up office  visit. Compared to previous EKGs, her EKGs now demonstrate T-wave inversion  in V4 through V6 as well as I and aVL. These are completely new. We have  repeated the EKG in the office today and the findings are similar. There is  some loss of volume in lead I.  She denies any ongoing exertional chest  pain. She now is  brought in, because of the marked EKG changes and the prior  history of with an ejection fraction of 20% previously, for a cardiac  catheterization.   The patient has a past medical history remarkable for hemorrhoids,  hemorrhoidectomy. She has had breast augmentation as well as rhinoplasty.  She takes no medications except for enteric-coated aspirin 81 mg daily and  Fosamax once daily. She has no known drug allergies.   SOCIAL HISTORY:  The patient works in the [employer]. She has a 29 year old  son who lives with the child's father.   REVIEW OF SYSTEMS:  Essentially negative. She denies any diuretic abuse or  eating disorders.   PHYSICAL EXAMINATION:  GENERAL: She is an alert and oriented female in no  acute distress. She is a thin, white female in no acute distress.  VITAL SIGNS: Blood pressure is 110/74, weight 125, pulse 79.  NECK: There is no jugular venous distention. I cannot appreciate significant  carotid bruits.  LUNGS: Clear to auscultation and percussion.  CARDIAC: Regular  with a nondisplaced PMI. Femoral pulses are intact. I  cannot appreciate femoral bruits, although I did notice one previously on  examination.  Distal pulses are intact.   The electrocardiograms are reviewed in careful detail. There is clear cut T-  wave inversion noted with biphasic T-waves in V2 and V3, and T-wave flips in  V4 through V6. Likewise, there is T-wave inversion in I and aVL.  The T-wave  inversion has been previously noted, but I and V4 through V6 are clearly new  compared to previous tracings.   I spoke with the patient about possibly coming into the hospital on the  evening that she was seen in the office. She clearly does not want to do  this and she is agreeable to a cardiac catheterization tomorrow. The risks,  benefits, and alternatives have been discussed with the patient in detail.  She does consent to proceed. This will be arranged with Dr. Loraine Leriche Pulsipher.        TDS/MEDQ  D:  07/31/2004  T:  07/31/2004  Job:  [id number]

## 2012-05-06 ENCOUNTER — Ambulatory Visit (INDEPENDENT_AMBULATORY_CARE_PROVIDER_SITE_OTHER): Payer: BC Managed Care – PPO | Admitting: Family Medicine

## 2012-05-06 VITALS — BP 140/81 | HR 93 | Temp 97.6°F | Resp 16 | Ht 64.5 in | Wt 125.0 lb

## 2012-05-06 DIAGNOSIS — H6120 Impacted cerumen, unspecified ear: Secondary | ICD-10-CM

## 2012-05-06 DIAGNOSIS — J40 Bronchitis, not specified as acute or chronic: Secondary | ICD-10-CM

## 2012-05-06 MED ORDER — LEVOFLOXACIN 500 MG PO TABS: 500.0000 mg | ORAL_TABLET | Freq: Every day | ORAL | Status: AC

## 2012-05-06 MED ORDER — HYDROCODONE-HOMATROPINE 5-1.5 MG/5ML PO SYRP: 5.0000 mL | ORAL_SOLUTION | Freq: Three times a day (TID) | ORAL | Status: AC | PRN

## 2012-05-06 MED ORDER — AZITHROMYCIN 250 MG PO TABS: ORAL_TABLET | ORAL | Status: DC

## 2012-05-06 NOTE — Progress Notes (Signed)
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  Not improving with Nyquil or Mucinex.  PMHx:  H/o pneumonia 15 years ago (hospitalized) No hemoptysis  Objective:  NAD TM's cerumen impaction Oroph: clear Neck: supple, no adenopathy Heart:  Reg, no murmur Chest: few ronchi  Assessment: Cerumen impaction  1. Bronchitis  HYDROcodone-homatropine (HYCODAN) 5-1.5 MG/5ML syrup, levofloxacin (LEVAQUIN) 500 MG tablet, DISCONTINUED: azithromycin (ZITHROMAX Z-PAK) 250 MG tablet

## 2013-10-17 ENCOUNTER — Ambulatory Visit: Payer: Self-pay | Admitting: Cardiovascular Disease

## 2017-04-16 ENCOUNTER — Ambulatory Visit: Payer: Self-pay | Admitting: Physician Assistant

## 2017-07-28 DIAGNOSIS — I10 Essential (primary) hypertension: Secondary | ICD-10-CM | POA: Diagnosis not present

## 2017-07-28 DIAGNOSIS — T148XXA Other injury of unspecified body region, initial encounter: Secondary | ICD-10-CM | POA: Diagnosis not present

## 2017-07-28 DIAGNOSIS — Z23 Encounter for immunization: Secondary | ICD-10-CM | POA: Diagnosis not present

## 2017-07-28 DIAGNOSIS — E559 Vitamin D deficiency, unspecified: Secondary | ICD-10-CM | POA: Diagnosis not present

## 2017-07-28 MED FILL — HYDROCHLOROTHIAZIDE 25 MG T: 25 | 30 days supply | Qty: 30 | Fill #0

## 2017-07-28 MED FILL — VIT D2 1.25 MG (50,000 UNIT: 1.25 MG | 28 days supply | Qty: 4 | Fill #0

## 2017-08-11 DIAGNOSIS — R5383 Other fatigue: Secondary | ICD-10-CM | POA: Diagnosis not present

## 2017-08-11 DIAGNOSIS — R0602 Shortness of breath: Secondary | ICD-10-CM | POA: Diagnosis not present

## 2017-08-11 DIAGNOSIS — R9431 Abnormal electrocardiogram [ECG] [EKG]: Secondary | ICD-10-CM | POA: Diagnosis not present

## 2017-08-11 DIAGNOSIS — Z0000 Encounter for general adult medical examination without abnormal findings: Secondary | ICD-10-CM | POA: Diagnosis not present

## 2017-08-11 DIAGNOSIS — E559 Vitamin D deficiency, unspecified: Secondary | ICD-10-CM | POA: Diagnosis not present

## 2017-08-11 DIAGNOSIS — Z114 Encounter for screening for human immunodeficiency virus [HIV]: Secondary | ICD-10-CM | POA: Diagnosis not present

## 2017-08-27 MED FILL — HYDROCHLOROTHIAZIDE 25 MG T: 25 | 90 days supply | Qty: 90 | Fill #0

## 2017-09-03 MED FILL — POTASSIUM CL ER 10 MEQ TABL: 10 | 30 days supply | Qty: 30 | Fill #0

## 2017-09-03 MED FILL — AMLODIPINE BESYLATE 5 MG TA: 5 | 30 days supply | Qty: 30 | Fill #0

## 2017-11-18 MED FILL — HYDROCHLOROTHIAZIDE 25 MG T: 25 | 90 days supply | Qty: 90 | Fill #0

## 2018-03-04 MED FILL — HYDROCHLOROTHIAZIDE 25 MG T: 25 | 90 days supply | Qty: 90 | Fill #0

## 2018-03-04 MED FILL — VIT D2 1.25 MG (50,000 UNIT: 1.25 MG | 84 days supply | Qty: 12 | Fill #0

## 2018-03-04 MED FILL — valACYclovir HCL 1 GM TABS: 1 | 7 days supply | Qty: 14 | Fill #0

## 2018-06-03 MED FILL — HYDROCHLOROTHIAZIDE 25 MG T: 25 | 90 days supply | Qty: 90 | Fill #0

## 2018-06-03 MED FILL — SERTRALINE HCL 50 MG TABLET: 50 | 30 days supply | Qty: 30 | Fill #0

## 2018-08-19 MED FILL — valACYclovir HCL 1 GM TABS: 1 | 7 days supply | Qty: 14 | Fill #1

## 2018-09-02 DIAGNOSIS — I10 Essential (primary) hypertension: Secondary | ICD-10-CM | POA: Diagnosis not present

## 2018-09-02 DIAGNOSIS — R5383 Other fatigue: Secondary | ICD-10-CM | POA: Diagnosis not present

## 2018-09-02 DIAGNOSIS — E559 Vitamin D deficiency, unspecified: Secondary | ICD-10-CM | POA: Diagnosis not present

## 2018-09-02 DIAGNOSIS — E7800 Pure hypercholesterolemia, unspecified: Secondary | ICD-10-CM | POA: Diagnosis not present

## 2018-09-02 DIAGNOSIS — R739 Hyperglycemia, unspecified: Secondary | ICD-10-CM | POA: Diagnosis not present

## 2018-09-02 MED FILL — VIT D2 1.25 MG (50,000 UNIT: 1.25 MG | 84 days supply | Qty: 12 | Fill #0

## 2018-09-02 MED FILL — HYDROCHLOROTHIAZIDE 25 MG T: 25 | 90 days supply | Qty: 90 | Fill #0

## 2018-09-04 DIAGNOSIS — H6123 Impacted cerumen, bilateral: Secondary | ICD-10-CM | POA: Diagnosis not present

## 2018-09-04 DIAGNOSIS — J40 Bronchitis, not specified as acute or chronic: Secondary | ICD-10-CM | POA: Diagnosis not present

## 2018-10-04 DIAGNOSIS — H524 Presbyopia: Secondary | ICD-10-CM | POA: Diagnosis not present

## 2018-10-16 DIAGNOSIS — J22 Unspecified acute lower respiratory infection: Secondary | ICD-10-CM | POA: Diagnosis not present

## 2018-10-16 DIAGNOSIS — R062 Wheezing: Secondary | ICD-10-CM | POA: Diagnosis not present

## 2018-10-16 DIAGNOSIS — F17200 Nicotine dependence, unspecified, uncomplicated: Secondary | ICD-10-CM | POA: Diagnosis not present

## 2018-10-17 ENCOUNTER — Other Ambulatory Visit: Payer: Self-pay

## 2018-10-17 ENCOUNTER — Observation Stay (HOSPITAL_COMMUNITY)
Admission: EM | Admit: 2018-10-17 | Discharge: 2018-10-18 | Disposition: A | Discharge status: Home / Self Care | Payer: 59 | Attending: Internal Medicine | Admitting: Internal Medicine

## 2018-10-17 ENCOUNTER — Encounter (HOSPITAL_COMMUNITY): Payer: Self-pay | Admitting: *Deleted

## 2018-10-17 ENCOUNTER — Emergency Department (HOSPITAL_COMMUNITY): Payer: 59

## 2018-10-17 DIAGNOSIS — Z8249 Family history of ischemic heart disease and other diseases of the circulatory system: Secondary | ICD-10-CM | POA: Insufficient documentation

## 2018-10-17 DIAGNOSIS — F17210 Nicotine dependence, cigarettes, uncomplicated: Secondary | ICD-10-CM | POA: Diagnosis not present

## 2018-10-17 DIAGNOSIS — R1031 Right lower quadrant pain: Secondary | ICD-10-CM | POA: Diagnosis not present

## 2018-10-17 DIAGNOSIS — Z79899 Other long term (current) drug therapy: Secondary | ICD-10-CM | POA: Diagnosis not present

## 2018-10-17 DIAGNOSIS — R140 Abdominal distension (gaseous): Secondary | ICD-10-CM | POA: Diagnosis not present

## 2018-10-17 DIAGNOSIS — E871 Hypo-osmolality and hyponatremia: Principal | ICD-10-CM | POA: Diagnosis present

## 2018-10-17 DIAGNOSIS — I10 Essential (primary) hypertension: Secondary | ICD-10-CM | POA: Diagnosis not present

## 2018-10-17 DIAGNOSIS — R112 Nausea with vomiting, unspecified: Secondary | ICD-10-CM | POA: Diagnosis present

## 2018-10-17 DIAGNOSIS — E876 Hypokalemia: Secondary | ICD-10-CM | POA: Diagnosis not present

## 2018-10-17 DIAGNOSIS — R1032 Left lower quadrant pain: Secondary | ICD-10-CM | POA: Diagnosis not present

## 2018-10-17 HISTORY — DX: Essential (primary) hypertension: I10

## 2018-10-17 LAB — CBC
HCT: 41.5 % (ref 36.0–46.0)
HCT: 36.2 % (ref 36.0–46.0)
Hemoglobin: 15.2 g/dL — ABNORMAL HIGH (ref 12.0–15.0)
Hemoglobin: 13.2 g/dL (ref 12.0–15.0)
MCH: 30.6 pg (ref 26.0–34.0)
MCH: 30.7 pg (ref 26.0–34.0)
MCHC: 36.6 g/dL — ABNORMAL HIGH (ref 30.0–36.0)
MCHC: 36.5 g/dL — ABNORMAL HIGH (ref 30.0–36.0)
MCV: 83.5 fL (ref 80.0–100.0)
MCV: 84.2 fL (ref 80.0–100.0)
Platelets: 205 10*3/uL (ref 150–400)
Platelets: 155 10*3/uL (ref 150–400)
RBC: 4.97 MIL/uL (ref 3.87–5.11)
RBC: 4.3 MIL/uL (ref 3.87–5.11)
RDW: 11.9 % (ref 11.5–15.5)
RDW: 11.9 % (ref 11.5–15.5)
WBC: 7.7 10*3/uL (ref 4.0–10.5)
WBC: 10.9 10*3/uL — ABNORMAL HIGH (ref 4.0–10.5)
nRBC: 0 % (ref 0.0–0.2)
nRBC: 0 % (ref 0.0–0.2)

## 2018-10-17 LAB — URINALYSIS, ROUTINE W REFLEX MICROSCOPIC
Bacteria, UA: NONE SEEN
Bilirubin Urine: NEGATIVE
Glucose, UA: NEGATIVE mg/dL
KETONES UR: 5 mg/dL — AB
Leukocytes,Ua: NEGATIVE
Nitrite: NEGATIVE
Protein, ur: NEGATIVE mg/dL
Specific Gravity, Urine: 1.025 (ref 1.005–1.030)
pH: 8 (ref 5.0–8.0)

## 2018-10-17 LAB — BASIC METABOLIC PANEL
Anion gap: 14 (ref 5–15)
BUN: 5 mg/dL — ABNORMAL LOW (ref 6–20)
CO2: 24 mmol/L (ref 22–32)
Calcium: 7.9 mg/dL — ABNORMAL LOW (ref 8.9–10.3)
Chloride: 77 mmol/L — ABNORMAL LOW (ref 98–111)
Creatinine, Ser: 0.52 mg/dL (ref 0.44–1.00)
GFR calc Af Amer: >60 mL/min (ref >60)
GFR calc non Af Amer: >60 mL/min (ref >60)
GLUCOSE: 114 mg/dL — AB (ref 70–99)
Potassium: 3.1 mmol/L — ABNORMAL LOW (ref 3.5–5.1)
Sodium: 115 mmol/L — CL (ref 135–145)

## 2018-10-17 LAB — COMPREHENSIVE METABOLIC PANEL
ALT: 19 U/L (ref 0–44)
AST: 38 U/L (ref 15–41)
Albumin: 4.7 g/dL (ref 3.5–5.0)
Alkaline Phosphatase: 102 U/L (ref 38–126)
Anion gap: 18 — ABNORMAL HIGH (ref 5–15)
BUN: 6 mg/dL (ref 6–20)
CHLORIDE: 70 mmol/L — AB (ref 98–111)
CO2: 25 mmol/L (ref 22–32)
Calcium: 9.1 mg/dL (ref 8.9–10.3)
Creatinine, Ser: 0.59 mg/dL (ref 0.44–1.00)
GFR calc Af Amer: >60 mL/min (ref >60)
GFR calc non Af Amer: >60 mL/min (ref >60)
Glucose, Bld: 144 mg/dL — ABNORMAL HIGH (ref 70–99)
Potassium: 2.7 mmol/L — CL (ref 3.5–5.1)
SODIUM: 113 mmol/L — AB (ref 135–145)
Total Bilirubin: 0.8 mg/dL (ref 0.3–1.2)
Total Protein: 7.7 g/dL (ref 6.5–8.1)

## 2018-10-17 LAB — MAGNESIUM
MAGNESIUM: 1.9 mg/dL (ref 1.7–2.4)
Magnesium: 1.8 mg/dL (ref 1.7–2.4)

## 2018-10-17 LAB — LIPASE, BLOOD: Lipase: 52 U/L — ABNORMAL HIGH (ref 11–51)

## 2018-10-17 LAB — I-STAT BETA HCG BLOOD, ED (MC, WL, AP ONLY): I-stat hCG, quantitative: 5.6 m[IU]/mL — ABNORMAL HIGH (ref <5)

## 2018-10-17 LAB — TSH: TSH: 0.972 u[IU]/mL (ref 0.350–4.500)

## 2018-10-17 LAB — SODIUM, URINE, RANDOM: Sodium, Ur: 22 mmol/L

## 2018-10-17 MED ORDER — METOCLOPRAMIDE HCL 5 MG/ML IJ SOLN
5.0000 mg | Freq: Once | INTRAMUSCULAR | Status: AC
  Administered 2018-10-17: 5 mg via INTRAVENOUS
  Filled 2018-10-17: qty 2

## 2018-10-17 MED ORDER — SODIUM CHLORIDE (PF) 0.9 % IJ SOLN
INTRAMUSCULAR | Status: AC
  Filled 2018-10-17: qty 50

## 2018-10-17 MED ORDER — IOPAMIDOL (ISOVUE-300) INJECTION 61%
100.0000 mL | Freq: Once | INTRAVENOUS | Status: AC | PRN
  Administered 2018-10-17: 100 mL via INTRAVENOUS

## 2018-10-17 MED ORDER — HYDRALAZINE HCL 20 MG/ML IJ SOLN: 5.0000 mg | INTRAMUSCULAR | Status: DC | PRN

## 2018-10-17 MED ORDER — PNEUMOCOCCAL VAC POLYVALENT 25 MCG/0.5ML IJ INJ
0.5000 mL | INJECTION | INTRAMUSCULAR | Status: DC
  Filled 2018-10-17: qty 0.5

## 2018-10-17 MED ORDER — POTASSIUM CHLORIDE 10 MEQ/100ML IV SOLN
10.0000 meq | INTRAVENOUS | Status: AC
  Administered 2018-10-17 – 2018-10-18 (×4): 10 meq via INTRAVENOUS
  Filled 2018-10-17 (×4): qty 100

## 2018-10-17 MED ORDER — ACETAMINOPHEN 650 MG RE SUPP: 650.0000 mg | Freq: Four times a day (QID) | RECTAL | Status: DC | PRN

## 2018-10-17 MED ORDER — IOPAMIDOL (ISOVUE-300) INJECTION 61%
INTRAVENOUS | Status: AC
  Filled 2018-10-17: qty 100

## 2018-10-17 MED ORDER — SODIUM CHLORIDE 0.9 % IV SOLN
INTRAVENOUS | Status: DC
  Administered 2018-10-17: 21:00:00 via INTRAVENOUS

## 2018-10-17 MED ORDER — ONDANSETRON HCL 4 MG PO TABS
4.0000 mg | ORAL_TABLET | Freq: Four times a day (QID) | ORAL | Status: DC | PRN
  Filled 2018-10-17: qty 1

## 2018-10-17 MED ORDER — SODIUM CHLORIDE 0.9% FLUSH: 3.0000 mL | Freq: Once | INTRAVENOUS | Status: DC

## 2018-10-17 MED ORDER — ONDANSETRON HCL 4 MG/2ML IJ SOLN
4.0000 mg | Freq: Four times a day (QID) | INTRAMUSCULAR | Status: DC | PRN
  Administered 2018-10-17: 4 mg via INTRAVENOUS
  Filled 2018-10-17: qty 2

## 2018-10-17 MED ORDER — ACETAMINOPHEN 325 MG PO TABS: 650.0000 mg | ORAL_TABLET | Freq: Four times a day (QID) | ORAL | Status: DC | PRN

## 2018-10-17 MED ORDER — SODIUM CHLORIDE 0.9 % IV SOLN: INTRAVENOUS | Status: DC

## 2018-10-17 MED ORDER — ENOXAPARIN SODIUM 40 MG/0.4ML ~~LOC~~ SOLN
40.0000 mg | Freq: Every day | SUBCUTANEOUS | Status: DC
  Administered 2018-10-17: 40 mg via SUBCUTANEOUS
  Filled 2018-10-17: qty 0.4

## 2018-10-17 MED ORDER — MORPHINE SULFATE (PF) 4 MG/ML IV SOLN
4.0000 mg | Freq: Once | INTRAVENOUS | Status: AC
  Administered 2018-10-17: 4 mg via INTRAVENOUS
  Filled 2018-10-17: qty 1

## 2018-10-17 MED ORDER — SODIUM CHLORIDE 0.9 % IV BOLUS
1000.0000 mL | Freq: Once | INTRAVENOUS | Status: AC
  Administered 2018-10-17: 1000 mL via INTRAVENOUS

## 2018-10-17 NOTE — ED Provider Notes (Signed)
[facility]-EMERGENCY DEPT Provider Note   CSN: [id number] Arrival date & time: 10/17/18  1925    History   Chief Complaint Chief Complaint  Patient presents with  . Emesis  . Nausea    HPI Miranda [name] is a 63 y.o. female.     63 year old female presents with several days of URI symptoms.  Went to urgent care and was prescribed medication for sinus infection as well as nausea.  Feels that the Zofran she was prescribed is make her symptoms worse.  She now notes nonbilious/bloody emesis with some associated bilateral lower abdominal discomfort.  Denies any dysuria or flank pain.  No vaginal bleeding or discharge.  Pain is persistent and characterizes sharp.  No treatment used for this prior to arrival     Past Medical History:  Diagnosis Date  . Hypertension     Patient Active Problem List   Diagnosis Date Noted  . CIGARETTE SMOKER 09/19/2009  . OBSTRUCTIVE CHRONIC BRONCHITIS WITH EXACERBATION 09/19/2009  . BACK PAIN, LUMBAR 09/19/2009  . ELEVATED BLOOD PRESSURE 09/13/2008  . GERD 09/12/2008  . HEADACHE 09/12/2008  . HEMORRHOIDS 08/13/2007  . ANXIETY 08/12/2007  . DEPRESSION 08/12/2007  . OSTEOARTHRITIS 08/12/2007  . OSTEOPOROSIS 08/12/2007    Past Surgical History:  Procedure Laterality Date  . BREAST ENHANCEMENT SURGERY    . SPINE SURGERY       OB History   No obstetric history on file.      Home Medications    Prior to Admission medications   Medication Sig Start Date End Date Taking? Authorizing Provider  dextromethorphan-guaiFENesin (MUCINEX DM) 30-600 MG per 12 hr tablet Take 1 tablet by mouth every 12 (twelve) hours.    [provider]    Family History Family History  Problem Relation Age of Onset  . Heart disease Father     Social History Social History   Tobacco Use  . Smoking status: Current Every Day Smoker    Packs/day: 1.00    Years: 34.00    Pack years: 34.00    Types: Cigarettes    Substance Use Topics  . Alcohol use: Never    Frequency: Never  . Drug use: Never     Allergies   Patient has no known allergies.   Review of Systems Review of Systems  All other systems reviewed and are negative.    Physical Exam Updated Vital Signs BP (!) 169/90 (BP Location: Right Arm)   Pulse 90   Temp 97.9 F (36.6 C) (Oral)   Resp 16   Ht 1.626 m (5\' 4" )   Wt 55.8 kg   SpO2 95%   BMI 21.11 kg/m   Physical Exam Vitals signs and nursing note reviewed.  Constitutional:      General: She is not in acute distress.    Appearance: Normal appearance. She is well-developed. She is not toxic-appearing.  HENT:     Head: Normocephalic and atraumatic.  Eyes:     General: Lids are normal.     Conjunctiva/sclera: Conjunctivae normal.     Pupils: Pupils are equal, round, and reactive to light.  Neck:     Musculoskeletal: Normal range of motion and neck supple.     Thyroid: No thyroid mass.     Trachea: No tracheal deviation.  Cardiovascular:     Rate and Rhythm: Normal rate and regular rhythm.     Heart sounds: Normal heart sounds. No murmur. No gallop.   Pulmonary:  Effort: Pulmonary effort is normal. No respiratory distress.     Breath sounds: Normal breath sounds. No stridor. No decreased breath sounds, wheezing, rhonchi or rales.  Abdominal:     General: Bowel sounds are normal. There is no distension.     Palpations: Abdomen is soft.     Tenderness: There is abdominal tenderness in the right lower quadrant and left lower quadrant. There is no rebound.    Musculoskeletal: Normal range of motion.        General: No tenderness.  Skin:    General: Skin is warm and dry.     Findings: No abrasion or rash.  Neurological:     Mental Status: She is alert and oriented to person, place, and time.     GCS: GCS eye subscore is 4. GCS verbal subscore is 5. GCS motor subscore is 6.     Cranial Nerves: No cranial nerve deficit.     Sensory: No sensory deficit.   Psychiatric:        Speech: Speech normal.        Behavior: Behavior normal.      ED Treatments / Results  Labs (all labs ordered are listed, but only abnormal results are displayed) Labs Reviewed  CBC - Abnormal; Notable for the following components:      Result Value   Hemoglobin 15.2 (*)    MCHC 36.6 (*)    All other components within normal limits  I-STAT BETA HCG BLOOD, ED (MC, WL, AP ONLY) - Abnormal; Notable for the following components:   I-stat hCG, quantitative 5.6 (*)    All other components within normal limits  LIPASE, BLOOD  COMPREHENSIVE METABOLIC PANEL  URINALYSIS, ROUTINE W REFLEX MICROSCOPIC    EKG None  Radiology No results found.  Procedures Procedures (including critical care time)  Medications Ordered in ED Medications  sodium chloride flush (NS) 0.9 % injection 3 mL (3 mLs Intravenous Not Given 10/17/18 2016)  metoCLOPramide (REGLAN) injection 5 mg (has no administration in time range)  morphine 4 MG/ML injection 4 mg (has no administration in time range)     Initial Impression / Assessment and Plan / ED Course  I have reviewed the triage vital signs and the nursing notes.  Pertinent labs & imaging results that were available during my care of the patient were reviewed by me and considered in my medical decision making (see chart for details).       Pt with abd pain and abd ct negative Pt with severe hyponatremia here Suspect volume depletion and diuretic use as cause Will give iv fluids and admit to hospitalist service  Final Clinical Impressions(s) / ED Diagnoses   Final diagnoses:  None    ED Discharge Orders    None       Lorre Nick, MD 10/20/18 1032

## 2018-10-17 NOTE — ED Notes (Signed)
Pt to CT

## 2018-10-17 NOTE — H&P (Signed)
History and Physical    Miranda [name] IOM:[id number] DOB: 1956/03/03 DOA: 10/17/2018  PCP: System, Pcp Not In  Patient coming from: Home.  Chief Complaint: Nausea vomiting.  HPI: Miranda [name] is a 63 y.o. female with history of hypertension presents to the ER with complaints of nausea vomiting.  Patient has been having the symptoms for last 48 hours.  Had used antibiotics last month for sinusitis.  Has mild diarrhea 2.  Denies any abdominal pain.  Denies any weakness or loss of consciousness.  Denies any headache or any extremity weakness.  ED Course: In the ER patient was found to have a sodium of 113 and potassium was around 2.7.  Patient takes hydrochlorothiazide.  Patient was given fluid bolus in the ER and started on infusion for hyponatremia likely from hydrochlorothiazide and nausea vomiting.  Urine studies are pending.  Review of Systems: As per HPI, rest all negative.   Past Medical History:  Diagnosis Date  . Hypertension     Past Surgical History:  Procedure Laterality Date  . BREAST ENHANCEMENT SURGERY    . SPINE SURGERY       reports that she has been smoking cigarettes. She has a 34.00 pack-year smoking history. She has never used smokeless tobacco. She reports that she does not drink alcohol or use drugs.  No Known Allergies  Family History  Problem Relation Age of Onset  . Heart disease Father     Prior to Admission medications   Medication Sig Start Date End Date Taking? Authorizing Provider  hydrochlorothiazide (HYDRODIURIL) 25 MG tablet Take 25 mg by mouth daily. 09/02/18  Yes [provider]  valACYclovir (VALTREX) 1000 MG tablet Take 1,000 mg by mouth daily as needed. 08/19/18  Yes [provider]    Physical Exam: Vitals:   10/17/18 1949 10/17/18 2100 10/17/18 2130  BP: (!) 169/90 136/75 132/76  Pulse: 90 78 78  Resp: 16  18  Temp: 97.9 F (36.6 C)    TempSrc: Oral    SpO2: 95% 93% 95%  Weight: 55.8 kg      Height: 5\' 4"  (1.626 m)        Constitutional: Moderately built and nourished. Vitals:   10/17/18 1949 10/17/18 2100 10/17/18 2130  BP: (!) 169/90 136/75 132/76  Pulse: 90 78 78  Resp: 16  18  Temp: 97.9 F (36.6 C)    TempSrc: Oral    SpO2: 95% 93% 95%  Weight: 55.8 kg    Height: 5\' 4"  (1.626 m)     Eyes: Anicteric no pallor. ENMT: No discharge from the ears eyes nose or mouth. Neck: No mass felt.  No neck rigidity. Respiratory: No rhonchi or crepitations. Cardiovascular: S1-S2 heard. Abdomen: Soft nontender bowel sounds present. Musculoskeletal: No edema.  No joint effusion. Skin: No rash. Neurologic: Alert awake oriented to time place and person.  Moves all extremities. Psychiatric: Appears normal.  Normal affect.   Labs on Admission: I have personally reviewed following labs and imaging studies  CBC: Recent Labs  Lab 10/17/18 1956  WBC 7.7  HGB 15.2*  HCT 41.5  MCV 83.5  PLT 205   Basic Metabolic Panel: Recent Labs  Lab 10/17/18 1956 10/17/18 2043  NA 113*  --   K 2.7*  --   CL 70*  --   CO2 25  --   GLUCOSE 144*  --   BUN 6  --   CREATININE 0.59  --   CALCIUM 9.1  --  MG  --  1.9   GFR: Estimated Creatinine Clearance: 64.6 mL/min (by C-G formula based on SCr of 0.59 mg/dL). Liver Function Tests: Recent Labs  Lab 10/17/18 1956  AST 38  ALT 19  ALKPHOS 102  BILITOT 0.8  PROT 7.7  ALBUMIN 4.7   Recent Labs  Lab 10/17/18 1956  LIPASE 52*   No results for input(s): AMMONIA in the last 168 hours. Coagulation Profile: No results for input(s): INR, PROTIME in the last 168 hours. Cardiac Enzymes: No results for input(s): CKTOTAL, CKMB, CKMBINDEX, TROPONINI in the last 168 hours. BNP (last 3 results) No results for input(s): PROBNP in the last 8760 hours. HbA1C: No results for input(s): HGBA1C in the last 72 hours. CBG: No results for input(s): GLUCAP in the last 168 hours. Lipid Profile: No results for input(s): CHOL, HDL, LDLCALC,  TRIG, CHOLHDL, LDLDIRECT in the last 72 hours. Thyroid Function Tests: No results for input(s): TSH, T4TOTAL, FREET4, T3FREE, THYROIDAB in the last 72 hours. Anemia Panel: No results for input(s): VITAMINB12, FOLATE, FERRITIN, TIBC, IRON, RETICCTPCT in the last 72 hours. Urine analysis:    Component Value Date/Time   COLORURINE YELLOW 05/22/2009 1200   APPEARANCEUR CLOUDY (A) 05/22/2009 1200   LABSPEC 1.008 05/22/2009 1200   PHURINE 7.0 05/22/2009 1200   GLUCOSEU NEGATIVE 05/22/2009 1200   GLUCOSEU NEGATIVE 10/13/2006 1005   HGBUR TRACE (A) 05/22/2009 1200   BILIRUBINUR NEGATIVE 05/22/2009 1200   KETONESUR NEGATIVE 05/22/2009 1200   PROTEINUR NEGATIVE 05/22/2009 1200   UROBILINOGEN 0.2 05/22/2009 1200   NITRITE NEGATIVE 05/22/2009 1200   LEUKOCYTESUR NEGATIVE 05/22/2009 1200   Sepsis Labs: @LABRCNTIP (procalcitonin:4,lacticidven:4) )No results found for this or any previous visit (from the past 240 hour(s)).   Radiological Exams on Admission: Ct Abdomen Pelvis W Contrast  Result Date: 10/17/2018 CLINICAL DATA:  Abdominal distention EXAM: CT ABDOMEN AND PELVIS WITH CONTRAST TECHNIQUE: Multidetector CT imaging of the abdomen and pelvis was performed using the standard protocol following bolus administration of intravenous contrast. CONTRAST:  ISOVUE-300 IOPAMIDOL (ISOVUE-300) INJECTION 61% COMPARISON:  None. FINDINGS: Lower chest: Linear scarring in the lung bases. Heart is borderline in size. No effusions. Hepatobiliary: No focal hepatic abnormality. Gallbladder unremarkable. Pancreas: No focal abnormality or ductal dilatation. Spleen: No focal abnormality.  Normal size. Adrenals/Urinary Tract: No adrenal abnormality. No focal renal abnormality. No stones or hydronephrosis. Urinary bladder is unremarkable. Stomach/Bowel: Normal appendix. Stomach, large and small bowel grossly unremarkable. Vascular/Lymphatic: Aortic atherosclerosis. No enlarged abdominal or pelvic lymph nodes.  Reproductive: Uterus and adnexa unremarkable.  No mass. Other: No free fluid or free air. Musculoskeletal: No acute bony abnormality. IMPRESSION: No acute findings in the abdomen or pelvis. Aortic atherosclerosis. Electronically Signed   By: Charlett Nose M.D.   On: 10/17/2018 21:28      Assessment/Plan Principal Problem:   Hyponatremia Active Problems:   Hypokalemia   Nausea & vomiting    1. Severe hyponatremia likely from using of hydrochlorothiazide and vomiting.  Check urine studies.  Patient received normal saline bolus in the ER for now I have placed patient on normal saline 75 cc/h closely follow sodium trends.  Follow TSH cortisol levels. 2. Hypertension we will keep patient on PRN albuterol as needed for now.  Discontinue hydrochlorothiazide from further use due to hyponatremia. 3. Nausea vomiting and diarrhea could be gastroenteritis.  Has improved. 4. Hypokalemia likely from vomiting and hydrochlorothiazide.  Replace recheck.  Check magnesium.   DVT prophylaxis: Lovenox. Code Status: Full code. Family Communication: Discussed with  patient. Disposition Plan: Home. Consults called: None. Admission status: Observation.   Eduard ClosArshad N Nika Yazzie MD [facility] Pager [phone].  If 7PM-7AM, please contact night-coverage [email or web address] Password [email or web address]  10/17/2018, 9:54 PM

## 2018-10-17 NOTE — ED Notes (Signed)
Pt aware that urine collection is needed

## 2018-10-17 NOTE — ED Triage Notes (Signed)
Pt stated "I went to the doctor yesterday and was given abx, zofran and Proair.  I haven't been able to eat or drink x 2 days.  The zofran is making me sick."

## 2018-10-17 NOTE — ED Notes (Signed)
Date and time results received: 10/17/18 2043 (use smartphrase ".now" to insert current time)  Test: K+ 2.7, Sodium 113 Critical Value: K+, Na+  Name of Provider Notified: Dr Lorre Nick  Orders Received? Or Actions Taken?: Actions Taken: notifed Dr Lorre Nick K+ 2.7, Sodium 113.

## 2018-10-17 NOTE — ED Notes (Signed)
ED TO INPATIENT HANDOFF REPORT  Name/Age/Gender Miranda [name] 63 y.o. female  Code Status    Code Status Orders  (From admission, onward)         Start     Ordered   10/17/18 2152  Full code  Continuous     10/17/18 2153        Code Status History    This patient has a current code status but no historical code status.      Home/SNF/Other Home  Chief Complaint Emesis/Nausea  Level of Care/Admitting Diagnosis ED Disposition    ED Disposition Condition Comment   Admit  Hospital Area: [facility] [[id number]]  Level of Care: Telemetry [5]  Admit to tele based on following criteria: Monitor QTC interval  Diagnosis: Hyponatremia [161096]  Admitting Physician: Eduard Clos [phone]  Attending Physician: Eduard Clos [phone]  PT Class (Do Not Modify): Observation [104]  PT Acc Code (Do Not Modify): Observation [10022]       Medical History Past Medical History:  Diagnosis Date  . Hypertension     Allergies No Known Allergies  IV Location/Drains/Wounds Patient Lines/Drains/Airways Status   Active Line/Drains/Airways    Name:   Placement date:   Placement time:   Site:   Days:   Peripheral IV 10/17/18 Right Wrist   10/17/18    2032    Wrist   less than 1          Labs/Imaging Results for orders placed or performed during the hospital encounter of 10/17/18 (from the past 48 hour(s))  Lipase, blood     Status: Abnormal   Collection Time: 10/17/18  7:56 PM  Result Value Ref Range   Lipase 52 (H) 11 - 51 U/L    Comment: Performed at [facility], [address], [place], [place] [address]  Comprehensive metabolic panel     Status: Abnormal   Collection Time: 10/17/18  7:56 PM  Result Value Ref Range   Sodium 113 (LL) 135 - 145 mmol/L    Comment: CRITICAL RESULT CALLED TO, READ BACK BY AND VERIFIED WITH: RN JEFFE B. AT 2041 10/17/18 CRUICKSHANK A    Potassium 2.7 (LL) 3.5 - 5.1 mmol/L    Comment:  CRITICAL RESULT CALLED TO, READ BACK BY AND VERIFIED WITH: RN JEFFE B. AT 2041 10/17/18 CRUICKSHANK     Chloride 70 (L) 98 - 111 mmol/L   CO2 25 22 - 32 mmol/L   Glucose, Bld 144 (H) 70 - 99 mg/dL   BUN 6 6 - 20 mg/dL   Creatinine, Ser 8.29 0.44 - 1.00 mg/dL   Calcium 9.1 8.9 - 56.2 mg/dL   Total Protein 7.7 6.5 - 8.1 g/dL   Albumin 4.7 3.5 - 5.0 g/dL   AST 38 15 - 41 U/L   ALT 19 0 - 44 U/L   Alkaline Phosphatase 102 38 - 126 U/L   Total Bilirubin 0.8 0.3 - 1.2 mg/dL   GFR calc non Af Amer >60 >60 mL/min   GFR calc Af Amer >60 >60 mL/min   Anion gap 18 (H) 5 - 15    Comment: Performed at [facility], [address], [place], [place] [address]  CBC     Status: Abnormal   Collection Time: 10/17/18  7:56 PM  Result Value Ref Range   WBC 7.7 4.0 - 10.5 K/uL   RBC 4.97 3.87 - 5.11 MIL/uL   Hemoglobin 15.2 (H) 12.0 - 15.0  g/dL   HCT 59.4 58.5 - 92.9 %   MCV 83.5 80.0 - 100.0 fL   MCH 30.6 26.0 - 34.0 pg   MCHC 36.6 (H) 30.0 - 36.0 g/dL   RDW 24.4 62.8 - 63.8 %   Platelets 205 150 - 400 K/uL   nRBC 0.0 0.0 - 0.2 %    Comment: Performed at [facility], [address] [place], [place] [address]  I-Stat beta hCG blood, ED     Status: Abnormal   Collection Time: 10/17/18  8:14 PM  Result Value Ref Range   I-stat hCG, quantitative 5.6 (H) <5 mIU/mL   Comment 3            Comment:   GEST. AGE      CONC.  (mIU/mL)   <=1 WEEK        5 - 50     2 WEEKS       50 - 500     3 WEEKS       100 - 10,000     4 WEEKS     1,000 - 30,000        FEMALE AND NON-PREGNANT FEMALE:     LESS THAN 5 mIU/mL   Magnesium     Status: None   Collection Time: 10/17/18  8:43 PM  Result Value Ref Range   Magnesium 1.9 1.7 - 2.4 mg/dL    Comment: Performed at [facility], [address] [place], [place] [address]   Ct Abdomen Pelvis W Contrast  Result Date: 10/17/2018 CLINICAL DATA:  Abdominal distention EXAM: CT ABDOMEN AND PELVIS WITH CONTRAST  TECHNIQUE: Multidetector CT imaging of the abdomen and pelvis was performed using the standard protocol following bolus administration of intravenous contrast. CONTRAST:  ISOVUE-300 IOPAMIDOL (ISOVUE-300) INJECTION 61% COMPARISON:  None. FINDINGS: Lower chest: Linear scarring in the lung bases. Heart is borderline in size. No effusions. Hepatobiliary: No focal hepatic abnormality. Gallbladder unremarkable. Pancreas: No focal abnormality or ductal dilatation. Spleen: No focal abnormality.  Normal size. Adrenals/Urinary Tract: No adrenal abnormality. No focal renal abnormality. No stones or hydronephrosis. Urinary bladder is unremarkable. Stomach/Bowel: Normal appendix. Stomach, large and small bowel grossly unremarkable. Vascular/Lymphatic: Aortic atherosclerosis. No enlarged abdominal or pelvic lymph nodes. Reproductive: Uterus and adnexa unremarkable.  No mass. Other: No free fluid or free air. Musculoskeletal: No acute bony abnormality. IMPRESSION: No acute findings in the abdomen or pelvis. Aortic atherosclerosis. Electronically Signed   By: Charlett Nose M.D.   On: 10/17/2018 21:28    Pending Labs Unresulted Labs (From admission, onward)    Start     Ordered   10/24/18 0500  Creatinine, serum  (enoxaparin (LOVENOX)    CrCl >/= 30 ml/min)  Weekly,   R    Comments:  while on enoxaparin therapy    10/17/18 2153   10/18/18 0500  CBC  Tomorrow morning,   R     10/17/18 2153   10/17/18 2153  Basic metabolic panel  Now then every 4 hours,   R     10/17/18 2153   10/17/18 2153  Magnesium  Once,   R     10/17/18 2153   10/17/18 2153  TSH  Once,   R     10/17/18 2153   10/17/18 2153  Cortisol  Once,   R     10/17/18 2153   10/17/18 2153  Osmolality  ONCE - STAT,   R     10/17/18  2153   10/17/18 2152  HIV antibody (Routine Testing)  Once,   R     10/17/18 2153   10/17/18 2152  CBC  (enoxaparin (LOVENOX)    CrCl >/= 30 ml/min)  Once,   R    Comments:  Baseline for enoxaparin therapy IF NOT  ALREADY DRAWN.  Notify MD if PLT < 100 K.    10/17/18 2153   10/17/18 2149  Sodium, urine, random  ONCE - STAT,   R     10/17/18 2148   10/17/18 2149  Osmolality, urine  ONCE - STAT,   R     10/17/18 2148   10/17/18 1956  Urinalysis, Routine w reflex microscopic  ONCE - STAT,   STAT     10/17/18 1956          Vitals/Pain Today's Vitals   10/17/18 1955 10/17/18 2100 10/17/18 2130 10/17/18 2200  BP:  136/75 132/76 (!) 142/88  Pulse:  78 78 87  Resp:   18   Temp:      TempSrc:      SpO2:  93% 95% 94%  Weight:      Height:      PainSc: 10-Worst pain ever       Isolation Precautions No active isolations  Medications Medications  sodium chloride flush (NS) 0.9 % injection 3 mL (3 mLs Intravenous Not Given 10/17/18 2016)  iopamidol (ISOVUE-300) 61 % injection (has no administration in time range)  sodium chloride (PF) 0.9 % injection (has no administration in time range)  0.9 %  sodium chloride infusion (has no administration in time range)  acetaminophen (TYLENOL) tablet 650 mg (has no administration in time range)    Or  acetaminophen (TYLENOL) suppository 650 mg (has no administration in time range)  ondansetron (ZOFRAN) tablet 4 mg (has no administration in time range)    Or  ondansetron (ZOFRAN) injection 4 mg (has no administration in time range)  enoxaparin (LOVENOX) injection 40 mg (has no administration in time range)  hydrALAZINE (APRESOLINE) injection 5 mg (has no administration in time range)  pneumococcal 23 valent vaccine (PNU-IMMUNE) injection 0.5 mL (has no administration in time range)  potassium chloride 10 mEq in 100 mL IVPB (has no administration in time range)  metoCLOPramide (REGLAN) injection 5 mg (5 mg Intravenous Given 10/17/18 2046)  morphine 4 MG/ML injection 4 mg (4 mg Intravenous Given 10/17/18 2047)  sodium chloride 0.9 % bolus 1,000 mL (1,000 mLs Intravenous New Bag/Given 10/17/18 2052)  iopamidol (ISOVUE-300) 61 % injection 100 mL (100 mLs  Intravenous Contrast Given 10/17/18 2111)    Mobility walks

## 2018-10-18 DIAGNOSIS — Z8249 Family history of ischemic heart disease and other diseases of the circulatory system: Secondary | ICD-10-CM | POA: Diagnosis not present

## 2018-10-18 DIAGNOSIS — E871 Hypo-osmolality and hyponatremia: Secondary | ICD-10-CM | POA: Diagnosis not present

## 2018-10-18 DIAGNOSIS — F17210 Nicotine dependence, cigarettes, uncomplicated: Secondary | ICD-10-CM | POA: Diagnosis not present

## 2018-10-18 DIAGNOSIS — E876 Hypokalemia: Secondary | ICD-10-CM | POA: Diagnosis not present

## 2018-10-18 DIAGNOSIS — Z79899 Other long term (current) drug therapy: Secondary | ICD-10-CM | POA: Diagnosis not present

## 2018-10-18 DIAGNOSIS — I10 Essential (primary) hypertension: Secondary | ICD-10-CM | POA: Diagnosis not present

## 2018-10-18 LAB — CBC
HCT: 37.2 % (ref 36.0–46.0)
Hemoglobin: 13.2 g/dL (ref 12.0–15.0)
MCH: 30.6 pg (ref 26.0–34.0)
MCHC: 35.5 g/dL (ref 30.0–36.0)
MCV: 86.1 fL (ref 80.0–100.0)
Platelets: 167 10*3/uL (ref 150–400)
RBC: 4.32 MIL/uL (ref 3.87–5.11)
RDW: 12.2 % (ref 11.5–15.5)
WBC: 5.7 10*3/uL (ref 4.0–10.5)
nRBC: 0 % (ref 0.0–0.2)

## 2018-10-18 LAB — BASIC METABOLIC PANEL
ANION GAP: 10 (ref 5–15)
ANION GAP: 8 (ref 5–15)
Anion gap: 13 (ref 5–15)
Anion gap: 9 (ref 5–15)
BUN: 5 mg/dL — ABNORMAL LOW (ref 6–20)
BUN: <5 mg/dL — ABNORMAL LOW (ref 6–20)
BUN: 5 mg/dL — ABNORMAL LOW (ref 6–20)
BUN: 5 mg/dL — ABNORMAL LOW (ref 6–20)
CALCIUM: 8.1 mg/dL — AB (ref 8.9–10.3)
CHLORIDE: 90 mmol/L — AB (ref 98–111)
CO2: 26 mmol/L (ref 22–32)
CO2: 28 mmol/L (ref 22–32)
CO2: 30 mmol/L (ref 22–32)
CO2: 27 mmol/L (ref 22–32)
CREATININE: 0.68 mg/dL (ref 0.44–1.00)
Calcium: 8.5 mg/dL — ABNORMAL LOW (ref 8.9–10.3)
Calcium: 8.5 mg/dL — ABNORMAL LOW (ref 8.9–10.3)
Calcium: 8.3 mg/dL — ABNORMAL LOW (ref 8.9–10.3)
Chloride: 81 mmol/L — ABNORMAL LOW (ref 98–111)
Chloride: 88 mmol/L — ABNORMAL LOW (ref 98–111)
Chloride: 93 mmol/L — ABNORMAL LOW (ref 98–111)
Creatinine, Ser: 0.57 mg/dL (ref 0.44–1.00)
Creatinine, Ser: 0.62 mg/dL (ref 0.44–1.00)
Creatinine, Ser: 0.65 mg/dL (ref 0.44–1.00)
GFR calc Af Amer: >60 mL/min (ref >60)
GFR calc Af Amer: >60 mL/min (ref >60)
GFR calc Af Amer: >60 mL/min (ref >60)
GFR calc non Af Amer: >60 mL/min (ref >60)
GFR calc non Af Amer: >60 mL/min (ref >60)
GFR calc non Af Amer: >60 mL/min (ref >60)
Glucose, Bld: 106 mg/dL — ABNORMAL HIGH (ref 70–99)
Glucose, Bld: 89 mg/dL (ref 70–99)
Glucose, Bld: 122 mg/dL — ABNORMAL HIGH (ref 70–99)
Glucose, Bld: 93 mg/dL (ref 70–99)
Potassium: 3.2 mmol/L — ABNORMAL LOW (ref 3.5–5.1)
Potassium: 3.4 mmol/L — ABNORMAL LOW (ref 3.5–5.1)
Potassium: 2.6 mmol/L — CL (ref 3.5–5.1)
Potassium: 3.2 mmol/L — ABNORMAL LOW (ref 3.5–5.1)
SODIUM: 128 mmol/L — AB (ref 135–145)
Sodium: 120 mmol/L — ABNORMAL LOW (ref 135–145)
Sodium: 126 mmol/L — ABNORMAL LOW (ref 135–145)
Sodium: 129 mmol/L — ABNORMAL LOW (ref 135–145)

## 2018-10-18 LAB — OSMOLALITY: Osmolality: 238 mOsm/kg — CL (ref 275–295)

## 2018-10-18 LAB — GLUCOSE, CAPILLARY: Glucose-Capillary: 91 mg/dL (ref 70–99)

## 2018-10-18 LAB — CORTISOL: CORTISOL PLASMA: 24.9 ug/dL

## 2018-10-18 LAB — OSMOLALITY, URINE: Osmolality, Ur: 205 mOsm/kg — ABNORMAL LOW (ref 300–900)

## 2018-10-18 LAB — HIV ANTIBODY (ROUTINE TESTING W REFLEX): HIV Screen 4th Generation wRfx: NONREACTIVE

## 2018-10-18 MED ORDER — POTASSIUM CHLORIDE CRYS ER 20 MEQ PO TBCR
40.0000 meq | EXTENDED_RELEASE_TABLET | Freq: Once | ORAL | Status: DC
  Filled 2018-10-18: qty 2

## 2018-10-18 NOTE — Discharge Summary (Signed)
Physician Discharge Summary  Miranda [name] EVO:[id number] DOB: [birth year] DOA: 10/17/2018  PCP: System, Pcp Not In  Admit date: 10/17/2018 Discharge date: 10/18/2018  Admitted From: Home Disposition:  Home  Recommendations for Outpatient Follow-up:  1. Follow up with PCP in 1-2 weeks 2. Please obtain BMP in one week   Home Health: No Equipment/Devices: None  Discharge Condition: Stable CODE STATUS: FULL Code Diet recommendation: Heart Healthy   Chief complaint: Nausea and vomiting  History of present illness:  Miranda [name] is a 63 y.o. female with history of hypertension presents to the ER with complaints of nausea vomiting.  Patient has been having the symptoms for last 48 hours.  Had used antibiotics last month for sinusitis.  Has mild diarrhea 2.  Denies any abdominal pain.  Denies any weakness or loss of consciousness.  Denies any headache or any extremity weakness.  ED Course: In the ER patient was found to have a sodium of 113 and potassium was around 2.7.  Patient takes hydrochlorothiazide.  Patient was given fluid bolus in the ER and started on infusion for hyponatremia likely from hydrochlorothiazide and nausea vomiting.  Urine studies are pending.  Hospital course:  Severe hyponatremia Nausea/vomiting Patient presenting from home with persistent nausea/vomiting over the past 48 hours.  Was found to have a sodium level of 113.  CT abdomen/pelvis with no acute findings.  Her TSH was within normal limits at 0.972, urine osmolality was 205, serum osmolality 238, cortisol level normal at 24.9, and urine sodium was 22.  Etiology likely multifactorial given nausea/vomiting consistent with hypovolemic hyponatremia also likely associated with thiazide induced as patient is on HCTZ.  Her HCTZ was held.  Patient was given gentle IV fluid hydration with improvement of her sodium to 129.  Patient's nausea and vomiting has resolved and she was able to tolerate oral intake  without issue.  Patient be discharged home with recommendations to stop HCTZ and follow-up with her PCP in 1 week for repeat BMP.  If patient requires further antihypertensive therapy recommend alternative medication.  Hypokalemia Potassium was noted to be 2.7 on admission.  Etiology likely GI loss from persistent nausea/vomiting over the previous 48 hours.  Patient potassium initially replace IV which stabilized following resolution of symptoms.  Recommend PCP repeat BMP in 1 week.  Essential hypertension Patient's HCTZ was discontinued secondary to severe hyponatremia as above.  Patient's blood pressure was stable/normal during the hospitalization.  Recommend alternative agents such as amlodipine in the future if patient requires therapy.   Discharge Diagnoses:  Principal Problem:   Hyponatremia    Discharge Instructions  Discharge Instructions    Call MD for:  difficulty breathing, headache or visual disturbances   Complete by:  As directed    Call MD for:  persistant dizziness or light-headedness   Complete by:  As directed    Call MD for:  persistant nausea and vomiting   Complete by:  As directed    Call MD for:  temperature >100.4   Complete by:  As directed    Diet - low sodium heart healthy   Complete by:  As directed    Increase activity slowly   Complete by:  As directed      Allergies as of 10/18/2018   No Known Allergies     Medication List    STOP taking these medications   hydrochlorothiazide 25 MG tablet Commonly known as:  HYDRODIURIL     TAKE these medications   valACYclovir 1000  MG tablet Commonly known as:  VALTREX Take 1,000 mg by mouth daily as needed.       No Known Allergies  Consultations: None   Procedures/Studies: Ct Abdomen Pelvis W Contrast  Result Date: 10/17/2018 CLINICAL DATA:  Abdominal distention EXAM: CT ABDOMEN AND PELVIS WITH CONTRAST TECHNIQUE: Multidetector CT imaging of the abdomen and pelvis was performed using the  standard protocol following bolus administration of intravenous contrast. CONTRAST:  ISOVUE-300 IOPAMIDOL (ISOVUE-300) INJECTION 61% COMPARISON:  None. FINDINGS: Lower chest: Linear scarring in the lung bases. Heart is borderline in size. No effusions. Hepatobiliary: No focal hepatic abnormality. Gallbladder unremarkable. Pancreas: No focal abnormality or ductal dilatation. Spleen: No focal abnormality.  Normal size. Adrenals/Urinary Tract: No adrenal abnormality. No focal renal abnormality. No stones or hydronephrosis. Urinary bladder is unremarkable. Stomach/Bowel: Normal appendix. Stomach, large and small bowel grossly unremarkable. Vascular/Lymphatic: Aortic atherosclerosis. No enlarged abdominal or pelvic lymph nodes. Reproductive: Uterus and adnexa unremarkable.  No mass. Other: No free fluid or free air. Musculoskeletal: No acute bony abnormality. IMPRESSION: No acute findings in the abdomen or pelvis. Aortic atherosclerosis. Electronically Signed   By: Charlett Nose M.D.   On: 10/17/2018 21:28       Subjective: Patient seen and examined at bedside, sitting up at side of bed.  Currently tolerating breakfast and oral intake.  States nausea/vomiting has now resolved.  Ready for discharge home.  No other complaints at this time.  Denies headache, no visual changes, no chest pain, no shortness of breath, no abdominal pain, no cough/congestion, no fever/chills/night sweats, no issues with bowel/bladder function, no paresthesias.  No acute events overnight per nursing staff.   Discharge Exam: Vitals:   10/17/18 2312 10/18/18 0548  BP: (!) 149/87 132/71  Pulse: 80 79  Resp: 14 20  Temp: 97.8 F (36.6 C) 98.8 F (37.1 C)  SpO2: 92% 92%   Vitals:   10/17/18 2230 10/17/18 2312 10/17/18 2313 10/18/18 0548  BP: (!) 145/70 (!) 149/87  132/71  Pulse: 80 80  79  Resp: Temp:  97.8 F (36.6 C)  98.8 F (37.1 C)  TempSrc:  Oral  Oral  SpO2: 94% 92%  92%  Weight:   57 kg   Height:     (1.626 m)     General: Pt is alert, awake, not in acute distress Cardiovascular: RRR, S1/S2 +, no rubs, no gallops Respiratory: CTA bilaterally, no wheezing, no rhonchi Abdominal: Soft, NT, ND, bowel sounds + Extremities: no edema, no cyanosis    The results of significant diagnostics from this hospitalization (including imaging, microbiology, ancillary and laboratory) are listed below for reference.     Microbiology: No results found for this or any previous visit (from the past 240 hour(s)).   Labs: BNP (last 3 results) No results for input(s): BNP in the last 8760 hours. Basic Metabolic Panel: Recent Labs  Lab 10/17/18 2043 10/17/18 2216 10/18/18 0146 10/18/18 0537 10/18/18 0938 10/18/18 1046  NA  --  115* 120* 126* 128* 129*  K  --  3.1* 3.2* 3.4* 2.6* 3.2*  CL  --  77* 81* 88* 90* 93*  CO2  --  GLUCOSE  --  114* 106* 89 122* 93  BUN  --  5* 5* <5* 5* 5*  CREATININE  --  0.52 0.68 0.57 0.62 0.65  CALCIUM  --  7.9* 8.5* 8.5* 8.3* 8.1*  MG 1.9 1.8  --   --   --   --  Liver Function Tests: Recent Labs  Lab 10/17/18 1956  AST 38  ALT 19  ALKPHOS 102  BILITOT 0.8  PROT 7.7  ALBUMIN 4.7   Recent Labs  Lab 10/17/18 1956  LIPASE 52*   No results for input(s): AMMONIA in the last 168 hours. CBC: Recent Labs  Lab 10/17/18 1956 10/17/18 2216 10/18/18 0537  WBC 7.7 10.9* 5.7  HGB 15.2* 13.2 13.2  HCT 41.5 36.2 37.2  MCV 83.5 84.2 86.1  PLT 205 155 167   Cardiac Enzymes: No results for input(s): CKTOTAL, CKMB, CKMBINDEX, TROPONINI in the last 168 hours. BNP: Invalid input(s): POCBNP CBG: No results for input(s): GLUCAP in the last 168 hours. D-Dimer No results for input(s): DDIMER in the last 72 hours. Hgb A1c No results for input(s): HGBA1C in the last 72 hours. Lipid Profile No results for input(s): CHOL, HDL, LDLCALC, TRIG, CHOLHDL, LDLDIRECT in the last 72 hours. Thyroid function studies Recent Labs     10/17/18 2216  TSH 0.972   Anemia work up No results for input(s): VITAMINB12, FOLATE, FERRITIN, TIBC, IRON, RETICCTPCT in the last 72 hours. Urinalysis    Component Value Date/Time   COLORURINE STRAW (A) 10/17/2018 1956   APPEARANCEUR CLEAR 10/17/2018 1956   LABSPEC 1.025 10/17/2018 1956   PHURINE 8.0 10/17/2018 1956   GLUCOSEU NEGATIVE 10/17/2018 1956   GLUCOSEU NEGATIVE 10/13/2006 1005   HGBUR MODERATE (A) 10/17/2018 1956   BILIRUBINUR NEGATIVE 10/17/2018 1956   KETONESUR 5 (A) 10/17/2018 1956   PROTEINUR NEGATIVE 10/17/2018 1956   UROBILINOGEN 0.2 05/22/2009 1200   NITRITE NEGATIVE 10/17/2018 1956   LEUKOCYTESUR NEGATIVE 10/17/2018 1956   Sepsis Labs Invalid input(s): PROCALCITONIN,  WBC,  LACTICIDVEN Microbiology No results found for this or any previous visit (from the past 240 hour(s)).   Time coordinating discharge: Over 30 minutes  SIGNED:   Alvira Philips [place], DO  [facility] 10/18/2018, 12:19 PM

## 2018-10-18 NOTE — Progress Notes (Signed)
CRITICAL VALUE ALERT  Critical Value:  Serum Osmolality of 238  Date & Time Notied:  10/18/18  03:02  Provider Notified: Donnamarie Poag, NP via text page  Orders Received/Actions taken:

## 2018-10-21 DIAGNOSIS — R05 Cough: Secondary | ICD-10-CM | POA: Diagnosis not present

## 2018-10-21 DIAGNOSIS — I10 Essential (primary) hypertension: Secondary | ICD-10-CM | POA: Diagnosis not present

## 2018-10-21 DIAGNOSIS — Z79899 Other long term (current) drug therapy: Secondary | ICD-10-CM | POA: Diagnosis not present

## 2018-10-21 DIAGNOSIS — E876 Hypokalemia: Secondary | ICD-10-CM | POA: Diagnosis not present

## 2018-10-21 DIAGNOSIS — J209 Acute bronchitis, unspecified: Secondary | ICD-10-CM | POA: Diagnosis not present

## 2018-10-21 DIAGNOSIS — R112 Nausea with vomiting, unspecified: Secondary | ICD-10-CM | POA: Diagnosis not present

## 2018-10-21 DIAGNOSIS — E860 Dehydration: Secondary | ICD-10-CM | POA: Diagnosis not present

## 2018-10-21 DIAGNOSIS — J40 Bronchitis, not specified as acute or chronic: Secondary | ICD-10-CM | POA: Diagnosis not present

## 2018-10-21 DIAGNOSIS — A048 Other specified bacterial intestinal infections: Secondary | ICD-10-CM | POA: Diagnosis not present

## 2018-10-21 DIAGNOSIS — J45901 Unspecified asthma with (acute) exacerbation: Secondary | ICD-10-CM | POA: Diagnosis not present

## 2018-10-25 DIAGNOSIS — R197 Diarrhea, unspecified: Secondary | ICD-10-CM | POA: Diagnosis not present

## 2018-10-25 DIAGNOSIS — J45901 Unspecified asthma with (acute) exacerbation: Secondary | ICD-10-CM | POA: Diagnosis not present

## 2018-10-25 DIAGNOSIS — J441 Chronic obstructive pulmonary disease with (acute) exacerbation: Secondary | ICD-10-CM | POA: Diagnosis not present

## 2018-10-25 DIAGNOSIS — I10 Essential (primary) hypertension: Secondary | ICD-10-CM | POA: Diagnosis not present

## 2018-11-05 DIAGNOSIS — R112 Nausea with vomiting, unspecified: Secondary | ICD-10-CM | POA: Diagnosis not present

## 2018-11-19 DIAGNOSIS — J40 Bronchitis, not specified as acute or chronic: Secondary | ICD-10-CM | POA: Diagnosis not present

## 2018-12-14 MED FILL — valACYclovir HCL 1 GM TABS: 1 | 7 days supply | Qty: 14 | Fill #2

## 2018-12-20 DIAGNOSIS — J40 Bronchitis, not specified as acute or chronic: Secondary | ICD-10-CM | POA: Diagnosis not present

## 2018-12-21 MED FILL — POTASSIUM CHL ER M10 TABLET: 10 | 30 days supply | Qty: 30 | Fill #0

## 2018-12-30 DIAGNOSIS — E559 Vitamin D deficiency, unspecified: Secondary | ICD-10-CM | POA: Diagnosis not present

## 2018-12-30 DIAGNOSIS — Z131 Encounter for screening for diabetes mellitus: Secondary | ICD-10-CM | POA: Diagnosis not present

## 2018-12-30 DIAGNOSIS — F17210 Nicotine dependence, cigarettes, uncomplicated: Secondary | ICD-10-CM | POA: Diagnosis not present

## 2018-12-30 DIAGNOSIS — I10 Essential (primary) hypertension: Secondary | ICD-10-CM | POA: Diagnosis not present

## 2018-12-30 DIAGNOSIS — R5383 Other fatigue: Secondary | ICD-10-CM | POA: Diagnosis not present

## 2018-12-30 DIAGNOSIS — E7800 Pure hypercholesterolemia, unspecified: Secondary | ICD-10-CM | POA: Diagnosis not present

## 2018-12-30 MED FILL — HYDROCHLOROTHIAZIDE 25 MG T: 25 | 90 days supply | Qty: 90 | Fill #0

## 2019-01-19 DIAGNOSIS — J40 Bronchitis, not specified as acute or chronic: Secondary | ICD-10-CM | POA: Diagnosis not present

## 2019-02-19 DIAGNOSIS — J40 Bronchitis, not specified as acute or chronic: Secondary | ICD-10-CM | POA: Diagnosis not present

## 2019-03-03 MED FILL — POTASSIUM CHL ER M10 TABLET: 10 | 30 days supply | Qty: 30 | Fill #1

## 2019-03-21 DIAGNOSIS — J40 Bronchitis, not specified as acute or chronic: Secondary | ICD-10-CM | POA: Diagnosis not present

## 2019-03-30 MED FILL — OMRON 3 SERIES BP MONITOR D: 1 days supply | Qty: 1 | Fill #0

## 2019-04-11 MED FILL — HYDROCHLOROTHIAZIDE 25 MG T: 25 | 90 days supply | Qty: 90 | Fill #1

## 2019-07-21 MED FILL — HYDROCHLOROTHIAZIDE 25 MG T: 25 | 30 days supply | Qty: 30 | Fill #0

## 2019-08-04 DIAGNOSIS — E7800 Pure hypercholesterolemia, unspecified: Secondary | ICD-10-CM | POA: Diagnosis not present

## 2019-08-04 DIAGNOSIS — I10 Essential (primary) hypertension: Secondary | ICD-10-CM | POA: Diagnosis not present

## 2019-08-04 DIAGNOSIS — R5383 Other fatigue: Secondary | ICD-10-CM | POA: Diagnosis not present

## 2019-08-04 DIAGNOSIS — Z1159 Encounter for screening for other viral diseases: Secondary | ICD-10-CM | POA: Diagnosis not present

## 2019-08-04 DIAGNOSIS — F17210 Nicotine dependence, cigarettes, uncomplicated: Secondary | ICD-10-CM | POA: Diagnosis not present

## 2019-08-04 DIAGNOSIS — Z131 Encounter for screening for diabetes mellitus: Secondary | ICD-10-CM | POA: Diagnosis not present

## 2019-08-04 DIAGNOSIS — E559 Vitamin D deficiency, unspecified: Secondary | ICD-10-CM | POA: Diagnosis not present

## 2019-08-04 DIAGNOSIS — Z79899 Other long term (current) drug therapy: Secondary | ICD-10-CM | POA: Diagnosis not present

## 2019-08-14 MED FILL — HYDROCHLOROTHIAZIDE 25 MG T: 25 | 90 days supply | Qty: 90 | Fill #0

## 2019-11-29 MED FILL — HYDROCHLOROTHIAZIDE 25 MG T: 25 | 90 days supply | Qty: 90 | Fill #1

## 2019-12-01 DIAGNOSIS — Z114 Encounter for screening for human immunodeficiency virus [HIV]: Secondary | ICD-10-CM | POA: Diagnosis not present

## 2019-12-01 DIAGNOSIS — R5383 Other fatigue: Secondary | ICD-10-CM | POA: Diagnosis not present

## 2019-12-01 DIAGNOSIS — D539 Nutritional anemia, unspecified: Secondary | ICD-10-CM | POA: Diagnosis not present

## 2019-12-01 DIAGNOSIS — Z13220 Encounter for screening for lipoid disorders: Secondary | ICD-10-CM | POA: Diagnosis not present

## 2019-12-01 DIAGNOSIS — Z131 Encounter for screening for diabetes mellitus: Secondary | ICD-10-CM | POA: Diagnosis not present

## 2019-12-01 DIAGNOSIS — Z1159 Encounter for screening for other viral diseases: Secondary | ICD-10-CM | POA: Diagnosis not present

## 2019-12-01 DIAGNOSIS — F17210 Nicotine dependence, cigarettes, uncomplicated: Secondary | ICD-10-CM | POA: Diagnosis not present

## 2019-12-01 DIAGNOSIS — I10 Essential (primary) hypertension: Secondary | ICD-10-CM | POA: Diagnosis not present

## 2019-12-01 DIAGNOSIS — Z0000 Encounter for general adult medical examination without abnormal findings: Secondary | ICD-10-CM | POA: Diagnosis not present

## 2019-12-01 DIAGNOSIS — R0602 Shortness of breath: Secondary | ICD-10-CM | POA: Diagnosis not present

## 2019-12-01 DIAGNOSIS — Z1339 Encounter for screening examination for other mental health and behavioral disorders: Secondary | ICD-10-CM | POA: Diagnosis not present

## 2019-12-01 DIAGNOSIS — E559 Vitamin D deficiency, unspecified: Secondary | ICD-10-CM | POA: Diagnosis not present

## 2019-12-01 MED FILL — POTASSIUM CHLORIDE CRYS ER: 10 | 30 days supply | Qty: 30 | Fill #0

## 2020-03-15 ENCOUNTER — Other Ambulatory Visit (HOSPITAL_COMMUNITY): Payer: Self-pay | Admitting: Nurse Practitioner

## 2020-03-15 MED FILL — POTASSIUM CL ER 10 MEQ TAB: 10 | 90 days supply | Qty: 90 | Fill #0

## 2020-03-15 MED FILL — HYDROCHLOROTHIAZIDE 25 MG T: 25 | 90 days supply | Qty: 90 | Fill #0

## 2020-03-25 IMAGING — CT CT ABD-PELV W/ CM
2 of 5 series · 16 of 46 positions shown, 18 images · IV contrast (ISOVUE)
Comparison: None.

CLINICAL DATA: Abdominal distention

EXAM:
CT ABDOMEN AND PELVIS WITH CONTRAST
TECHNIQUE: Multidetector CT imaging of the abdomen and pelvis was performed
using the standard protocol following bolus administration of
intravenous contrast.
CONTRAST:  100mL KVTA92-WLL IOPAMIDOL (KVTA92-WLL) INJECTION 61%

[Series 2: axial st · axial · 0.63mm/px · z∈[-453,-138]mm · 13 of 75 slices shown, 15 images]
[im 6/75  soft-tissue]
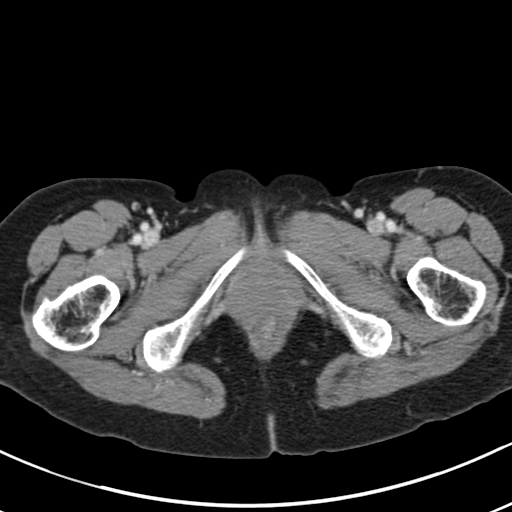
[im 6/75  bone]
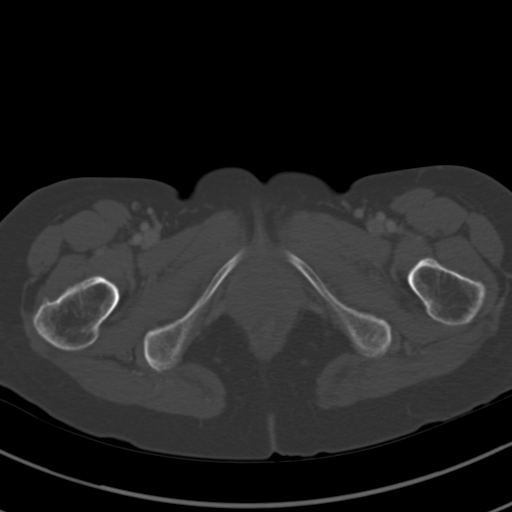
[im 11/75  soft-tissue]
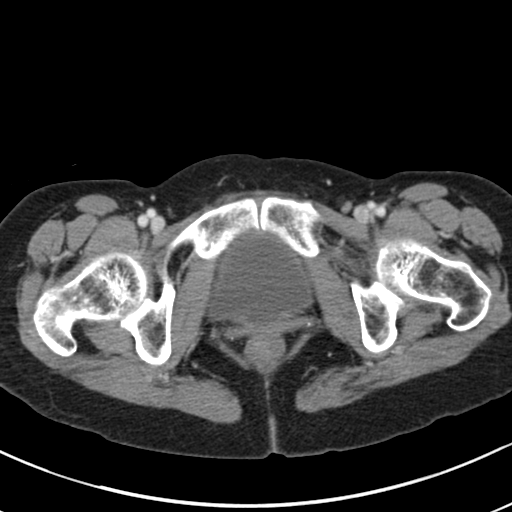
[im 16/75  soft-tissue]
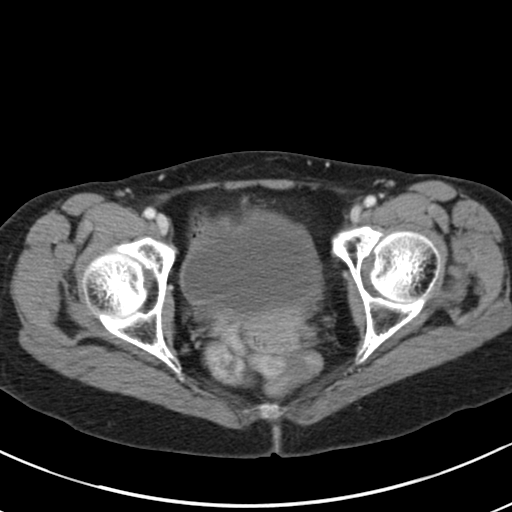
[im 22/75  soft-tissue]
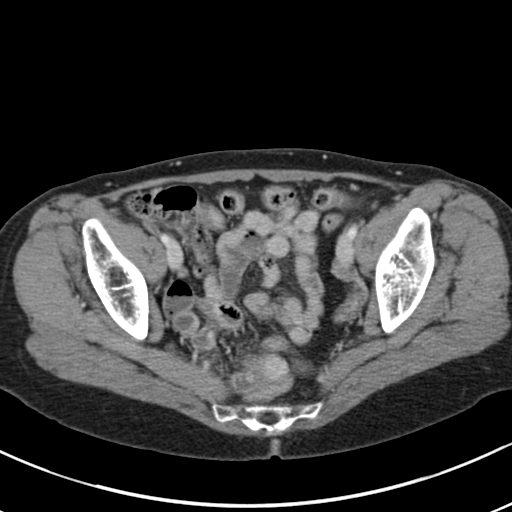
[im 27/75  soft-tissue]
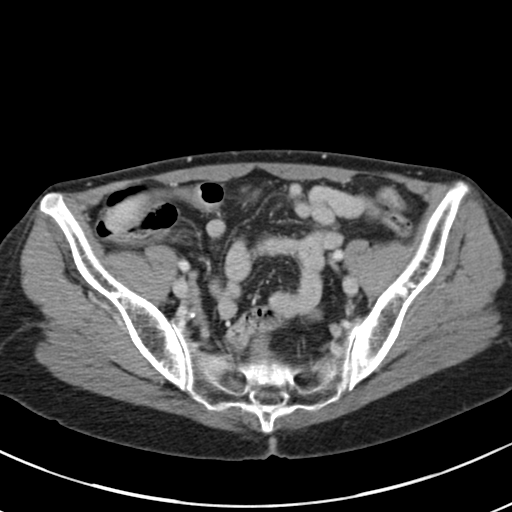
[im 32/75  soft-tissue]
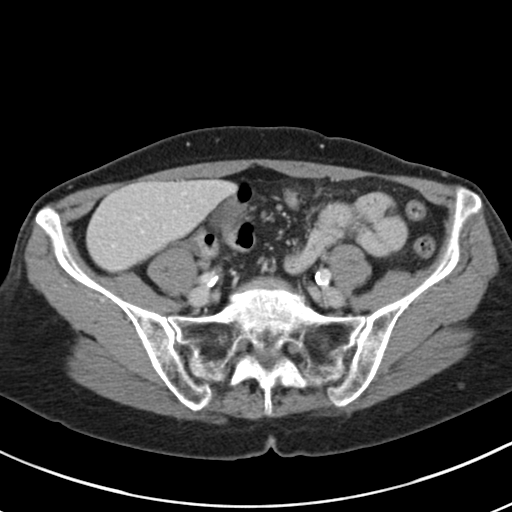
[im 38/75  soft-tissue]
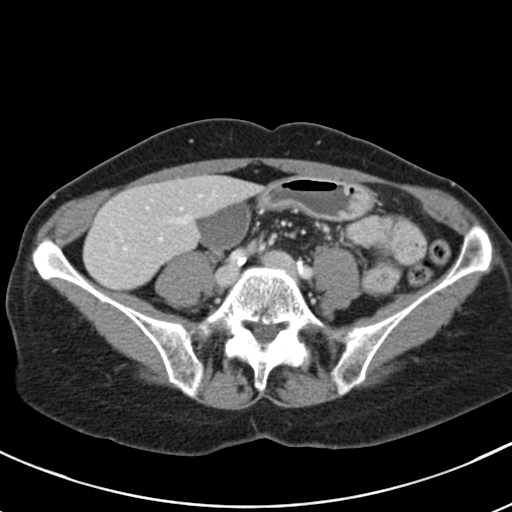
[im 43/75  soft-tissue]
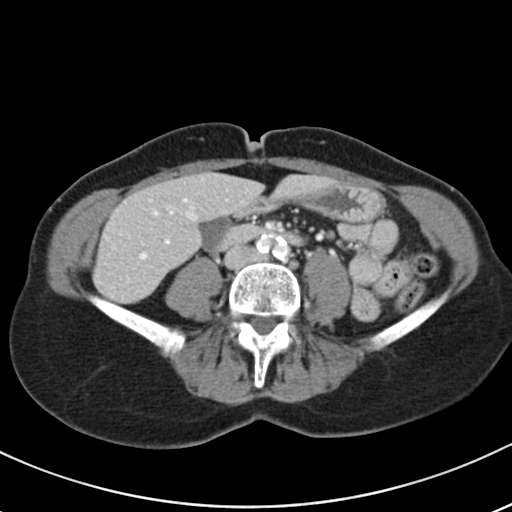
[im 48/75  soft-tissue]
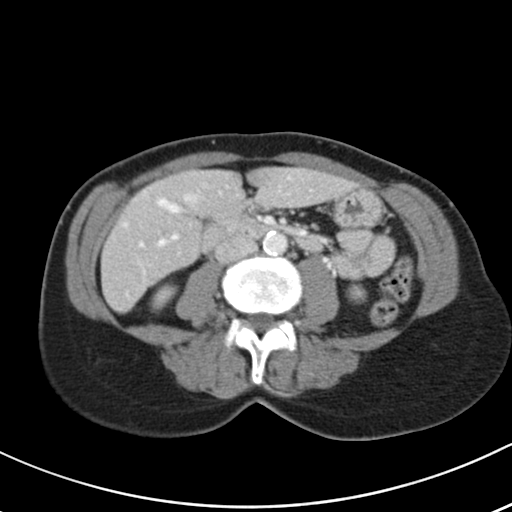
[im 48/75  bone]
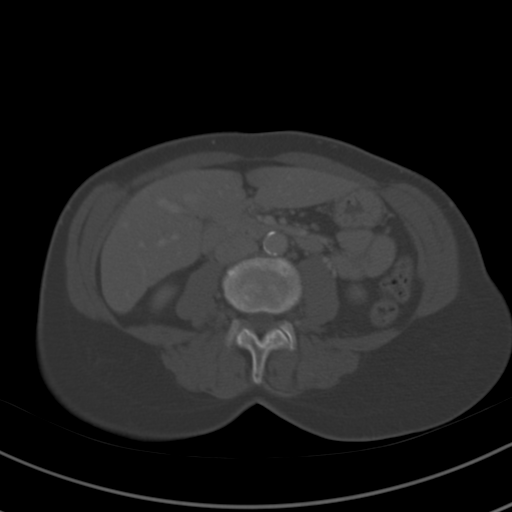
[im 53/75  soft-tissue]
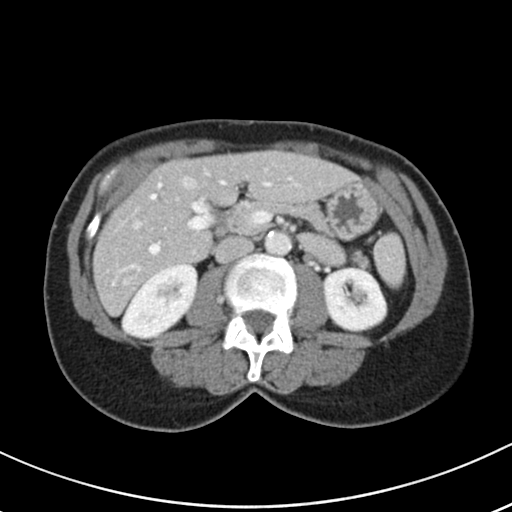
[im 59/75  soft-tissue]
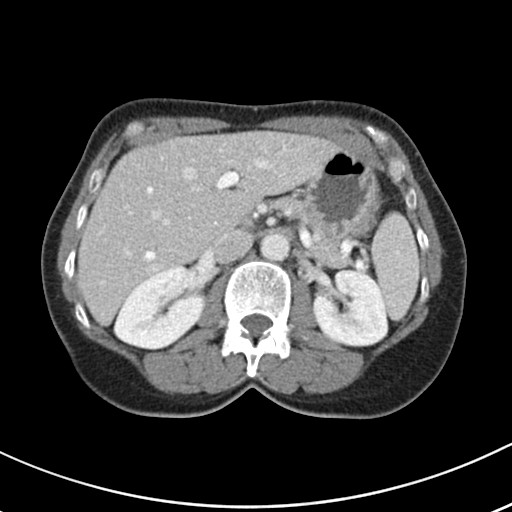
[im 64/75  soft-tissue]
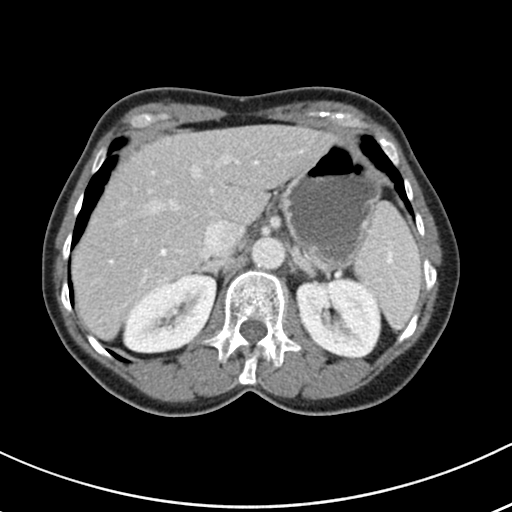
[im 69/75  soft-tissue]
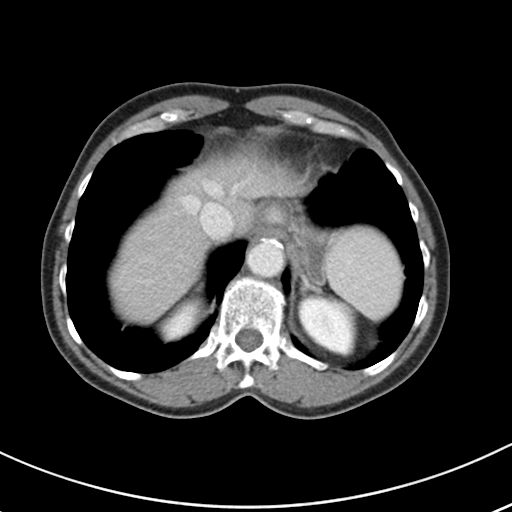

[Series 5: coronal st · coronal · 0.59mm/px · 3 of 100 slices shown]
[im 34/100  soft-tissue]
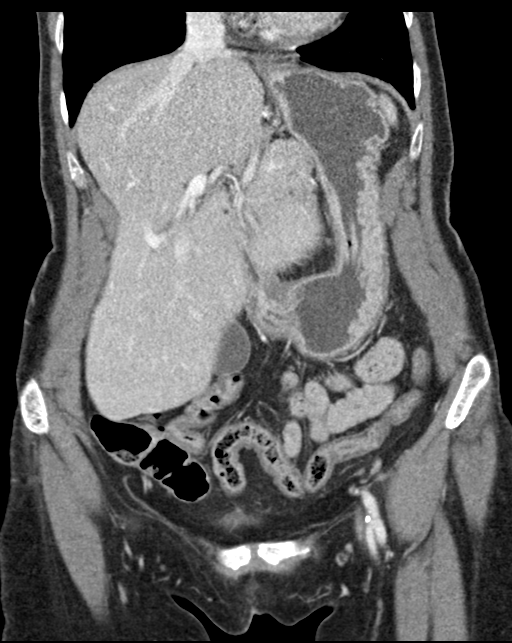
[im 45/100  soft-tissue]
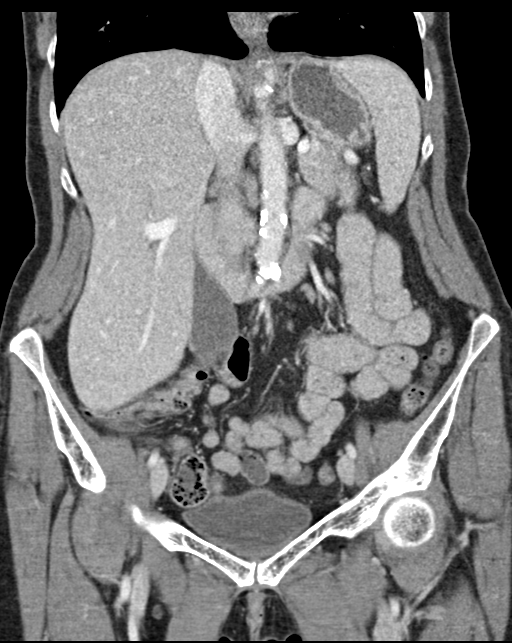
[im 56/100  soft-tissue]
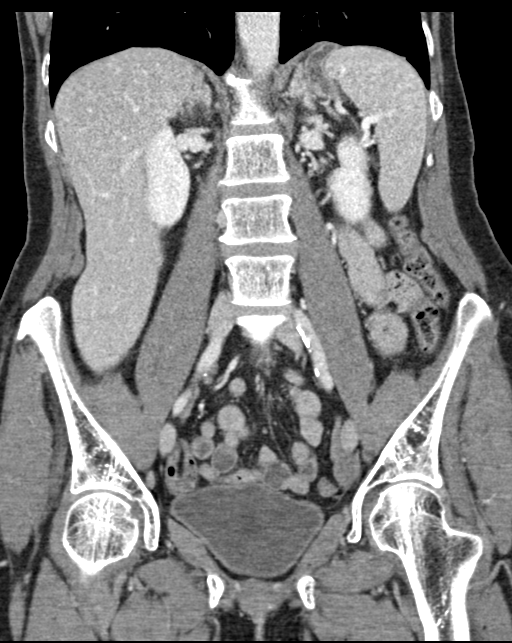

[16 of 46 positions shown; findings below may reference images not displayed]

FINDINGS: Lower chest: Linear scarring in the lung bases. Heart is borderline
in size. No effusions.

Hepatobiliary: No focal hepatic abnormality. Gallbladder
unremarkable.

Pancreas: No focal abnormality or ductal dilatation.

Spleen: No focal abnormality.  Normal size.

Adrenals/Urinary Tract: No adrenal abnormality. No focal renal
abnormality. No stones or hydronephrosis. Urinary bladder is
unremarkable.

Stomach/Bowel: Normal appendix. Stomach, large and small bowel
grossly unremarkable.

Vascular/Lymphatic: Aortic atherosclerosis. No enlarged abdominal or
pelvic lymph nodes.

Reproductive: Uterus and adnexa unremarkable.  No mass.

Other: No free fluid or free air.

Musculoskeletal: No acute bony abnormality.
IMPRESSION: No acute findings in the abdomen or pelvis.

Aortic atherosclerosis.

## 2020-06-28 ENCOUNTER — Other Ambulatory Visit (HOSPITAL_COMMUNITY): Payer: Self-pay | Admitting: Nurse Practitioner

## 2020-06-28 DIAGNOSIS — R5383 Other fatigue: Secondary | ICD-10-CM | POA: Diagnosis not present

## 2020-06-28 DIAGNOSIS — F17210 Nicotine dependence, cigarettes, uncomplicated: Secondary | ICD-10-CM | POA: Diagnosis not present

## 2020-06-28 DIAGNOSIS — R0602 Shortness of breath: Secondary | ICD-10-CM | POA: Diagnosis not present

## 2020-06-28 DIAGNOSIS — Z131 Encounter for screening for diabetes mellitus: Secondary | ICD-10-CM | POA: Diagnosis not present

## 2020-06-28 DIAGNOSIS — J439 Emphysema, unspecified: Secondary | ICD-10-CM | POA: Diagnosis not present

## 2020-06-28 DIAGNOSIS — Z79899 Other long term (current) drug therapy: Secondary | ICD-10-CM | POA: Diagnosis not present

## 2020-06-28 DIAGNOSIS — E7800 Pure hypercholesterolemia, unspecified: Secondary | ICD-10-CM | POA: Diagnosis not present

## 2020-06-28 DIAGNOSIS — E559 Vitamin D deficiency, unspecified: Secondary | ICD-10-CM | POA: Diagnosis not present

## 2020-06-28 DIAGNOSIS — D539 Nutritional anemia, unspecified: Secondary | ICD-10-CM | POA: Diagnosis not present

## 2020-06-28 DIAGNOSIS — Z20822 Contact with and (suspected) exposure to covid-19: Secondary | ICD-10-CM | POA: Diagnosis not present

## 2020-06-28 DIAGNOSIS — I10 Essential (primary) hypertension: Secondary | ICD-10-CM | POA: Diagnosis not present

## 2020-06-28 MED FILL — HYDROCHLOROTHIAZIDE 25 MG T: 25 | 90 days supply | Qty: 90 | Fill #1

## 2020-06-28 MED FILL — POTASSIUM CL ER 10 MEQ TAB: 10 | 90 days supply | Qty: 90 | Fill #0

## 2020-06-28 MED FILL — LISINOPRIL 2.5 MG TABLET: 2.5 | 90 days supply | Qty: 90 | Fill #0

## 2020-07-12 MED FILL — LISINOPRIL 2.5 MG TABLET: 2.5 | 90 days supply | Qty: 90 | Fill #0

## 2020-07-12 MED FILL — HYDROCHLOROTHIAZIDE 25 MG T: 25 | 90 days supply | Qty: 90 | Fill #1

## 2020-09-06 ENCOUNTER — Other Ambulatory Visit (HOSPITAL_BASED_OUTPATIENT_CLINIC_OR_DEPARTMENT_OTHER): Payer: Self-pay | Admitting: Internal Medicine

## 2020-09-06 ENCOUNTER — Ambulatory Visit: Payer: 59 | Attending: Internal Medicine

## 2020-09-06 DIAGNOSIS — Z23 Encounter for immunization: Secondary | ICD-10-CM

## 2020-09-06 NOTE — Progress Notes (Signed)
   Covid-19 Vaccination [facility]  Name:  Miranda [name]    MRN: [id number] DOB: 1955-11-18  09/06/2020  Miranda [name] was observed post Covid-19 immunization for 15 minutes without incident. She was provided with Vaccine Information Sheet and instruction to access the V-Safe system.   Miranda [name] was instructed to call 911 with any severe reactions post vaccine: Marland Kitchen Difficulty breathing  . Swelling of face and throat  . A fast heartbeat  . A bad rash all over body  . Dizziness and weakness   Immunizations Administered    Name Date Dose VIS Date Route   Pfizer COVID-19 Vaccine 09/06/2020  9:13 AM 0.3 mL 06/12/2020 Intramuscular   Manufacturer: [employer], [employer]   Lot: [id number]   NDC: [address]-8

## 2020-09-10 MED FILL — PFIZER-BIONTECH COVID-19 VA: 30 | 21 days supply | Qty: 0 | Fill #0

## 2020-11-22 DIAGNOSIS — J439 Emphysema, unspecified: Secondary | ICD-10-CM | POA: Diagnosis not present

## 2020-11-22 DIAGNOSIS — I10 Essential (primary) hypertension: Secondary | ICD-10-CM | POA: Diagnosis not present

## 2020-11-22 DIAGNOSIS — E7800 Pure hypercholesterolemia, unspecified: Secondary | ICD-10-CM | POA: Diagnosis not present

## 2020-11-22 DIAGNOSIS — F17210 Nicotine dependence, cigarettes, uncomplicated: Secondary | ICD-10-CM | POA: Diagnosis not present

## 2020-11-22 DIAGNOSIS — E559 Vitamin D deficiency, unspecified: Secondary | ICD-10-CM | POA: Diagnosis not present

## 2020-12-27 ENCOUNTER — Other Ambulatory Visit (HOSPITAL_COMMUNITY): Payer: Self-pay

## 2020-12-27 MED FILL — Lisinopril Tab 2.5 MG: ORAL | 90 days supply | Qty: 90 | Fill #0 | Status: CN

## 2021-01-04 ENCOUNTER — Other Ambulatory Visit (HOSPITAL_COMMUNITY): Payer: Self-pay

## 2021-01-09 ENCOUNTER — Other Ambulatory Visit (HOSPITAL_COMMUNITY): Payer: Self-pay

## 2021-01-09 DIAGNOSIS — I10 Essential (primary) hypertension: Secondary | ICD-10-CM | POA: Diagnosis not present

## 2021-01-09 DIAGNOSIS — R030 Elevated blood-pressure reading, without diagnosis of hypertension: Secondary | ICD-10-CM | POA: Diagnosis not present

## 2021-01-09 DIAGNOSIS — Z681 Body mass index (BMI) 19 or less, adult: Secondary | ICD-10-CM | POA: Diagnosis not present

## 2021-01-09 MED ORDER — HYDROCHLOROTHIAZIDE 25 MG PO TABS
1.0000 | ORAL_TABLET | Freq: Every day | ORAL | 1 refills | Status: DC
  Filled 2021-01-09: qty 90, 90d supply, fill #0
  Filled 2021-06-18: qty 90, 90d supply, fill #1

## 2021-01-10 ENCOUNTER — Other Ambulatory Visit (HOSPITAL_COMMUNITY): Payer: Self-pay

## 2021-06-18 ENCOUNTER — Other Ambulatory Visit (HOSPITAL_COMMUNITY): Payer: Self-pay

## 2021-07-21 ENCOUNTER — Emergency Department (HOSPITAL_BASED_OUTPATIENT_CLINIC_OR_DEPARTMENT_OTHER): Payer: 59

## 2021-07-21 ENCOUNTER — Other Ambulatory Visit: Payer: Self-pay

## 2021-07-21 ENCOUNTER — Inpatient Hospital Stay (HOSPITAL_BASED_OUTPATIENT_CLINIC_OR_DEPARTMENT_OTHER)
Admission: EM | Admit: 2021-07-21 | Discharge: 2021-07-23 | DRG: 178 | Disposition: A | Discharge status: Home / Self Care | Payer: 59 | Attending: Internal Medicine | Admitting: Internal Medicine

## 2021-07-21 DIAGNOSIS — R739 Hyperglycemia, unspecified: Secondary | ICD-10-CM | POA: Diagnosis present

## 2021-07-21 DIAGNOSIS — Z79899 Other long term (current) drug therapy: Secondary | ICD-10-CM

## 2021-07-21 DIAGNOSIS — R9431 Abnormal electrocardiogram [ECG] [EKG]: Secondary | ICD-10-CM | POA: Diagnosis not present

## 2021-07-21 DIAGNOSIS — R112 Nausea with vomiting, unspecified: Secondary | ICD-10-CM | POA: Diagnosis not present

## 2021-07-21 DIAGNOSIS — R0902 Hypoxemia: Secondary | ICD-10-CM | POA: Diagnosis present

## 2021-07-21 DIAGNOSIS — I10 Essential (primary) hypertension: Secondary | ICD-10-CM | POA: Diagnosis not present

## 2021-07-21 DIAGNOSIS — U071 COVID-19: Principal | ICD-10-CM | POA: Diagnosis present

## 2021-07-21 DIAGNOSIS — J449 Chronic obstructive pulmonary disease, unspecified: Secondary | ICD-10-CM | POA: Diagnosis not present

## 2021-07-21 DIAGNOSIS — E876 Hypokalemia: Secondary | ICD-10-CM | POA: Diagnosis not present

## 2021-07-21 DIAGNOSIS — F17210 Nicotine dependence, cigarettes, uncomplicated: Secondary | ICD-10-CM | POA: Diagnosis present

## 2021-07-21 DIAGNOSIS — E871 Hypo-osmolality and hyponatremia: Secondary | ICD-10-CM | POA: Diagnosis present

## 2021-07-21 DIAGNOSIS — R519 Headache, unspecified: Secondary | ICD-10-CM | POA: Diagnosis not present

## 2021-07-21 DIAGNOSIS — R197 Diarrhea, unspecified: Secondary | ICD-10-CM | POA: Diagnosis present

## 2021-07-21 DIAGNOSIS — R059 Cough, unspecified: Secondary | ICD-10-CM | POA: Diagnosis not present

## 2021-07-21 DIAGNOSIS — Z8249 Family history of ischemic heart disease and other diseases of the circulatory system: Secondary | ICD-10-CM | POA: Diagnosis not present

## 2021-07-21 DIAGNOSIS — K219 Gastro-esophageal reflux disease without esophagitis: Secondary | ICD-10-CM | POA: Diagnosis present

## 2021-07-21 DIAGNOSIS — R5383 Other fatigue: Secondary | ICD-10-CM | POA: Diagnosis not present

## 2021-07-21 DIAGNOSIS — R4182 Altered mental status, unspecified: Secondary | ICD-10-CM | POA: Diagnosis not present

## 2021-07-21 LAB — CBC
HCT: 41.5 % (ref 36.0–46.0)
Hemoglobin: 14.4 g/dL (ref 12.0–15.0)
MCH: 29.7 pg (ref 26.0–34.0)
MCHC: 34.7 g/dL (ref 30.0–36.0)
MCV: 85.6 fL (ref 80.0–100.0)
Platelets: 193 10*3/uL (ref 150–400)
RBC: 4.85 MIL/uL (ref 3.87–5.11)
RDW: 13.6 % (ref 11.5–15.5)
WBC: 6.4 10*3/uL (ref 4.0–10.5)
nRBC: 0 % (ref 0.0–0.2)

## 2021-07-21 LAB — URINALYSIS, ROUTINE W REFLEX MICROSCOPIC
Bilirubin Urine: NEGATIVE
Glucose, UA: NEGATIVE mg/dL
Ketones, ur: 15 mg/dL — AB
Leukocytes,Ua: NEGATIVE
Nitrite: NEGATIVE
Protein, ur: NEGATIVE mg/dL
Specific Gravity, Urine: 1.01 (ref 1.005–1.030)
pH: 6.5 (ref 5.0–8.0)

## 2021-07-21 LAB — RESP PANEL BY RT-PCR (FLU A&B, COVID) ARPGX2
Influenza A by PCR: NEGATIVE
Influenza B by PCR: NEGATIVE
SARS Coronavirus 2 by RT PCR: POSITIVE — AB

## 2021-07-21 LAB — COMPREHENSIVE METABOLIC PANEL
ALT: 10 U/L (ref 0–44)
AST: 17 U/L (ref 15–41)
Albumin: 4.6 g/dL (ref 3.5–5.0)
Alkaline Phosphatase: 94 U/L (ref 38–126)
Anion gap: 11 (ref 5–15)
BUN: 6 mg/dL — ABNORMAL LOW (ref 8–23)
CO2: 28 mmol/L (ref 22–32)
Calcium: 9.5 mg/dL (ref 8.9–10.3)
Chloride: 81 mmol/L — ABNORMAL LOW (ref 98–111)
Creatinine, Ser: 0.55 mg/dL (ref 0.44–1.00)
GFR, Estimated: >60 mL/min (ref >60)
Glucose, Bld: 120 mg/dL — ABNORMAL HIGH (ref 70–99)
Potassium: 2.9 mmol/L — ABNORMAL LOW (ref 3.5–5.1)
Sodium: 120 mmol/L — ABNORMAL LOW (ref 135–145)
Total Bilirubin: 0.5 mg/dL (ref 0.3–1.2)
Total Protein: 6.8 g/dL (ref 6.5–8.1)

## 2021-07-21 LAB — LIPASE, BLOOD: Lipase: 11 U/L (ref 11–51)

## 2021-07-21 LAB — MAGNESIUM: Magnesium: 1.8 mg/dL (ref 1.7–2.4)

## 2021-07-21 MED ORDER — ONDANSETRON 4 MG PO TBDP
4.0000 mg | ORAL_TABLET | Freq: Once | ORAL | Status: AC | PRN
  Administered 2021-07-21: 14:00:00 4 mg via ORAL
  Filled 2021-07-21: qty 1

## 2021-07-21 MED ORDER — SODIUM CHLORIDE 0.9 % IV BOLUS
1000.0000 mL | Freq: Once | INTRAVENOUS | Status: AC
  Administered 2021-07-21: 16:00:00 1000 mL via INTRAVENOUS

## 2021-07-21 MED ORDER — POTASSIUM CHLORIDE 10 MEQ/100ML IV SOLN
10.0000 meq | INTRAVENOUS | Status: AC
  Administered 2021-07-21 (×2): 10 meq via INTRAVENOUS
  Filled 2021-07-21 (×2): qty 100

## 2021-07-21 MED ORDER — ACETAMINOPHEN 325 MG PO TABS
650.0000 mg | ORAL_TABLET | Freq: Once | ORAL | Status: AC
  Administered 2021-07-21: 18:00:00 650 mg via ORAL
  Filled 2021-07-21: qty 2

## 2021-07-21 MED ORDER — PROCHLORPERAZINE EDISYLATE 10 MG/2ML IJ SOLN
10.0000 mg | Freq: Once | INTRAMUSCULAR | Status: AC
  Administered 2021-07-21: 17:00:00 10 mg via INTRAVENOUS
  Filled 2021-07-21: qty 2

## 2021-07-21 MED ORDER — SODIUM CHLORIDE 0.9 % IV BOLUS
500.0000 mL | Freq: Once | INTRAVENOUS | Status: AC
  Administered 2021-07-21: 17:00:00 500 mL via INTRAVENOUS

## 2021-07-21 MED ORDER — MORPHINE SULFATE (PF) 4 MG/ML IV SOLN
4.0000 mg | Freq: Once | INTRAVENOUS | Status: AC
  Administered 2021-07-21: 18:00:00 4 mg via INTRAVENOUS
  Filled 2021-07-21: qty 1

## 2021-07-21 MED ORDER — DIPHENHYDRAMINE HCL 50 MG/ML IJ SOLN
50.0000 mg | Freq: Once | INTRAMUSCULAR | Status: AC
  Administered 2021-07-21: 17:00:00 50 mg via INTRAVENOUS
  Filled 2021-07-21: qty 1

## 2021-07-21 MED ORDER — MAGNESIUM SULFATE 2 GM/50ML IV SOLN
2.0000 g | Freq: Once | INTRAVENOUS | Status: AC
  Administered 2021-07-21: 21:00:00 2 g via INTRAVENOUS
  Filled 2021-07-21: qty 50

## 2021-07-21 NOTE — ED Provider Notes (Signed)
[facility] EMERGENCY DEPT Provider Note   CSN: [id number] Arrival date & time: 07/21/21  1253     History Chief Complaint  Patient presents with   Emesis    Miranda [name] is a 65 y.o. female.  The history is provided by the patient and medical records. No language interpreter was used.  Emesis Severity:  Moderate Duration:  1 day Timing:  Constant Quality:  Stomach contents Progression:  Unchanged Chronicity:  Recurrent Recent urination:  Normal Relieved by:  Nothing Worsened by:  Nothing Ineffective treatments:  None tried Associated symptoms: chills, cough, diarrhea, headaches, myalgias and URI   Associated symptoms: no abdominal pain and no fever   Risk factors: no sick contacts       Past Medical History:  Diagnosis Date   Hypertension     Patient Active Problem List   Diagnosis Date Noted   Hyponatremia 10/17/2018   CIGARETTE SMOKER 09/19/2009   OBSTRUCTIVE CHRONIC BRONCHITIS WITH EXACERBATION 09/19/2009   BACK PAIN, LUMBAR 09/19/2009   ELEVATED BLOOD PRESSURE 09/13/2008   GERD 09/12/2008   HEADACHE 09/12/2008   HEMORRHOIDS 08/13/2007   ANXIETY 08/12/2007   DEPRESSION 08/12/2007   OSTEOARTHRITIS 08/12/2007   OSTEOPOROSIS 08/12/2007    Past Surgical History:  Procedure Laterality Date   BREAST ENHANCEMENT SURGERY     SPINE SURGERY       OB History   No obstetric history on file.     Family History  Problem Relation Age of Onset   Heart disease Father     Social History   Tobacco Use   Smoking status: Every Day    Packs/day: 1.00    Years: 34.00    Pack years: 34.00    Types: Cigarettes   Smokeless tobacco: Never  Substance Use Topics   Alcohol use: Never   Drug use: Never    Home Medications Prior to Admission medications   Medication Sig Start Date End Date Taking? Authorizing Provider  COVID-19 mRNA vaccine, Pfizer, 30 MCG/0.3ML injection INJECT AS DIRECTED 09/06/20 09/06/21  Carlyle Basques, MD   hydrochlorothiazide (HYDRODIURIL) 25 MG tablet TAKE 1 TABLET BY MOUTH ONCE DAILY 03/15/20 03/15/21  Simona Huh, NP  hydrochlorothiazide (HYDRODIURIL) 25 MG tablet Take 1 tablet by mouth daily 01/09/21     lisinopril (ZESTRIL) 2.5 MG tablet TAKE 1 TABLET BY MOUTH ONCE A DAY 06/28/20 06/28/21  Simona Huh, NP  valACYclovir (VALTREX) 1000 MG tablet Take 1,000 mg by mouth daily as needed. 08/19/18   [provider]    Allergies    Patient has no known allergies.  Review of Systems   Review of Systems  Constitutional:  Positive for chills and fatigue. Negative for diaphoresis and fever.  Eyes:  Negative for visual disturbance.  Respiratory:  Positive for cough. Negative for chest tightness, shortness of breath and wheezing.   Cardiovascular:  Negative for chest pain, palpitations and leg swelling.  Gastrointestinal:  Positive for diarrhea, nausea and vomiting. Negative for abdominal pain and constipation.  Genitourinary:  Positive for decreased urine volume. Negative for dysuria and flank pain.  Musculoskeletal:  Positive for myalgias. Negative for back pain, neck pain and neck stiffness.  Skin:  Negative for rash and wound.  Neurological:  Positive for headaches. Negative for dizziness, seizures, syncope, speech difficulty, weakness, light-headedness and numbness.  Psychiatric/Behavioral:  Negative for agitation and confusion.   All other systems reviewed and are negative.  Physical Exam Updated Vital Signs BP (!) 146/95 (BP Location: Right Arm)   Pulse  94   Temp 98.5 F (36.9 C)   Resp 20   SpO2 95%   Physical Exam Vitals and nursing note reviewed.  Constitutional:      General: She is not in acute distress.    Appearance: She is well-developed. She is not ill-appearing, toxic-appearing or diaphoretic.  HENT:     Head: Normocephalic and atraumatic.     Nose: Nose normal. No congestion or rhinorrhea.     Mouth/Throat:     Mouth: Mucous membranes are dry.     Pharynx:  No oropharyngeal exudate or posterior oropharyngeal erythema.  Eyes:     Extraocular Movements: Extraocular movements intact.     Conjunctiva/sclera: Conjunctivae normal.     Pupils: Pupils are equal, round, and reactive to light.  Cardiovascular:     Rate and Rhythm: Normal rate and regular rhythm.     Heart sounds: No murmur heard. Pulmonary:     Effort: Pulmonary effort is normal. No respiratory distress.     Breath sounds: Normal breath sounds. No wheezing, rhonchi or rales.  Chest:     Chest wall: No tenderness.  Abdominal:     General: Abdomen is flat.     Palpations: Abdomen is soft.     Tenderness: There is no abdominal tenderness. There is no right CVA tenderness, left CVA tenderness, guarding or rebound.  Musculoskeletal:        General: No swelling or tenderness.     Cervical back: Neck supple. No tenderness.     Right lower leg: No edema.     Left lower leg: No edema.  Skin:    General: Skin is warm and dry.     Capillary Refill: Capillary refill takes less than 2 seconds.     Findings: No erythema or rash.  Neurological:     General: No focal deficit present.     Mental Status: She is alert.     Sensory: No sensory deficit.     Motor: No weakness.  Psychiatric:        Mood and Affect: Mood normal.    ED Results / Procedures / Treatments   Labs (all labs ordered are listed, but only abnormal results are displayed) Labs Reviewed  RESP PANEL BY RT-PCR (FLU A&B, COVID) ARPGX2 - Abnormal; Notable for the following components:      Result Value   SARS Coronavirus 2 by RT PCR POSITIVE (*)    All other components within normal limits  COMPREHENSIVE METABOLIC PANEL - Abnormal; Notable for the following components:   Sodium 120 (*)    Potassium 2.9 (*)    Chloride 81 (*)    Glucose, Bld 120 (*)    BUN 6 (*)    All other components within normal limits  URINALYSIS, ROUTINE W REFLEX MICROSCOPIC - Abnormal; Notable for the following components:   Color, Urine  COLORLESS (*)    Hgb urine dipstick MODERATE (*)    Ketones, ur 15 (*)    All other components within normal limits  URINE CULTURE  LIPASE, BLOOD  CBC  MAGNESIUM    EKG None  Radiology CT Head Wo Contrast  Result Date: 07/21/2021 CLINICAL DATA:  Emesis, fatigue, nausea, headache. Mental status change. EXAM: CT HEAD WITHOUT CONTRAST TECHNIQUE: Contiguous axial images were obtained from the base of the skull through the vertex without intravenous contrast. COMPARISON:  None. FINDINGS: Brain: Small hypodense lesions along the posterior margins of the lentiform nuclei, dilated perivascular spaces favored over remote lacunar infarcts although  a 3 by 4 mm lesion along the margin of the right anterior thalamus on image 16 of series 3 could conceivably be a small remote lacunar infarct. Periventricular white matter and corona radiata hypodensities favor chronic ischemic microvascular white matter disease. Otherwise, the brainstem, cerebellum, cerebral peduncles, thalamus, basal ganglia, basilar cisterns, and ventricular system appear within normal limits. No intracranial hemorrhage, mass lesion, or acute CVA. Vascular: Unremarkable Skull: Unremarkable Sinuses/Orbits: Unremarkable Other: No supplemental non-categorized findings. IMPRESSION: 1. No acute intracranial findings. 2. Suspected small dilated perivascular spaces adjacent to the lentiform nuclei. 3. Questionable remote lacunar infarct along the margin of the right anterior thalamus. 4. Periventricular white matter and corona radiata hypodensities favor chronic ischemic microvascular white matter disease. Electronically Signed   By: Gaylyn Rong M.D.   On: 07/21/2021 18:53   DG Chest Portable 1 View  Result Date: 07/21/2021 CLINICAL DATA:  Cough. Positive COVID-19 test. Fatigue, vomiting, nausea and headache. Smoker. EXAM: PORTABLE CHEST 1 VIEW COMPARISON:  09/19/2009 FINDINGS: Normal sized heart. Clear lungs. Progressive hyperexpansion of  the lungs with mildly progressive accentuation of the interstitial markings. Stable mild peribronchial thickening. Diffuse osteopenia. IMPRESSION: 1. No acute abnormality. 2. Progressive changes of COPD with stable chronic bronchitic changes. Electronically Signed   By: Beckie Salts M.D.   On: 07/21/2021 16:49    Procedures Procedures   Medications Ordered in ED Medications  ondansetron (ZOFRAN-ODT) disintegrating tablet 4 mg (4 mg Oral Given 07/21/21 1337)  sodium chloride 0.9 % bolus 1,000 mL ( Intravenous Stopped 07/21/21 1754)  prochlorperazine (COMPAZINE) injection 10 mg (10 mg Intravenous Given 07/21/21 1654)  diphenhydrAMINE (BENADRYL) injection 50 mg (50 mg Intravenous Given 07/21/21 1655)  potassium chloride 10 mEq in 100 mL IVPB (0 mEq Intravenous Stopped 07/21/21 2039)  sodium chloride 0.9 % bolus 500 mL ( Intravenous Stopped 07/21/21 1955)  acetaminophen (TYLENOL) tablet 650 mg (650 mg Oral Given 07/21/21 1750)  morphine 4 MG/ML injection 4 mg (4 mg Intravenous Given 07/21/21 1751)  magnesium sulfate IVPB 2 g 50 mL (2 g Intravenous New Bag/Given 07/21/21 2040)    ED Course  I have reviewed the triage vital signs and the nursing notes.  Pertinent labs & imaging results that were available during my care of the patient were reviewed by me and considered in my medical decision making (see chart for details).    MDM Rules/Calculators/A&P                           Miranda [name] is a 65 y.o. female with a past medical history significant for hypertension, anxiety, and previous admission for hyponatremia who presents with 3 days of fatigue, dry cough, malaise, fatigue, and 24 hours of nausea, vomiting, and diarrhea.  Patient reports feeling very drained and fatigued similar to how she felt when she was significant hyponatremic and had to be admitted in the past.  She reports no sick contacts but does work as a Designer, industrial/product in the healthcare system.  She reports consistent nausea,  vomiting, diarrhea today and feels like she getting dehydrated.  Chart review shows that she is back on HCTZ which she had to stop last time she was hyponatremic during admission.  She reports does not cough but no production.  Denies chest pain or abdominal pain.  Denies back pain or flank pain.  Does report some moderate headache but denies any syncopal episodes or falls.  Denies any trauma.  Denies any vision changes, speech  difficulties or any neurologic complaints.  On exam, lungs are clear and chest is nontender.  Abdomen is nontender.  No focal neurologic deficits.  Patient has dry mucous membranes and appears uncomfortable.  Patient had work-up started in triage which did confirm she does have COVID-19.  I suspect this led to her nausea and vomiting and diarrhea, headache, cough, and malaise.  We will get chest x-ray due to the cough and will check urinalysis due to her reported some decreased urination.  We will get screening labs however the initial labs do show she has some hyponatremia again.  Previously she had to be admitted when her sodium was 113 now it is 120.  Potassium is also low.  We will get EKG.  Patient was given some nausea medicine earlier, will give a headache cocktail to help with both the nausea and the headache.  Will give Compazine and Benadryl as well as some more fluids.  We initially had a shared decision-making conversation about rehydration and electrolyte repletion.  Patient would like to see if she can get to feeling better after some IV potassium and fluids and nausea medicine/headache medicine.  If she is not better, she would like to go home however if she does not improve, anticipate she may need admission for further hydration and electrolyte monitoring in the setting of new COVID infection.  Given her lack of focal neurologic deficits, low suspicion for cerebral edema or other acute intracranial abnormality at this time.  We will be gentle with fluid rehydration  initially.  8:11 PM Work-up continue to return.  Patient's CT head did not show evidence of acute intracranial abnormality, does show evidence of old stroke.  Chest x-ray did not show pneumonia.  Patient continued to have nausea and vomiting despite some of the hydration and nausea medicine.  Headache was persistent so we gave some pain medicine.  Patient's QT is prolonged thus are limited by what we can prescribe her or give her for the nausea and vomiting.  As she is still not tolerating p.o. well, is hyponatremia, hypokalemic, and now was having oxygen saturations dipping into the 80s with her cough and COVID-19, do not feel she is safe for discharge home.  She is already received 2.5 L of fluid thus I am concerned about correcting too quickly with her sodium.  Do not feel she is safe for discharge home with the low oxygen saturations, soft pressures, and electrolyte imbalances without getting control of her p.o. intake at this time.  Patient will be admitted for further management.  Final Clinical Impression(s) / ED Diagnoses Final diagnoses:  COVID-19  Hyponatremia  Hypokalemia  Nausea vomiting and diarrhea  Hypoxia    Clinical Impression: 1. COVID-19   2. Hyponatremia   3. Hypokalemia   4. Nausea vomiting and diarrhea   5. Hypoxia     Disposition: Admit  This note was prepared with assistance of Dragon voice recognition software. Occasional wrong-word or sound-a-like substitutions may have occurred due to the inherent limitations of voice recognition software.     Kemontae Dunklee, Gwenyth Allegra, MD 07/21/21 2152

## 2021-07-21 NOTE — ED Notes (Signed)
Pt was placed on 2lpm [place] due to O2 sat dropping to 88-89% at times on room air.

## 2021-07-21 NOTE — Progress Notes (Signed)
  [facility] will assume care on arrival to accepting facility. Until arrival, care as per EDP. However, [facility] available 24/7 for questions and assistance.   Nursing staff please page [facility] Admits and Consults ([phone]) as soon as the patient arrives to the hospital.  Melisa Donofrio, DO [facility]  

## 2021-07-21 NOTE — ED Notes (Signed)
Patient transported to CT 

## 2021-07-21 NOTE — ED Triage Notes (Signed)
Patient reports to the ER for x2 days of emesis, fatigue, nausea, and headache

## 2021-07-21 NOTE — ED Notes (Signed)
Patient has been dropping down into high 80's while sleeping. When patient is awake her O2 sats are around 93% on RA but sleeping she is between 88-90% with decreased BP.

## 2021-07-21 NOTE — ED Notes (Signed)
Patient is more active and is utilizing bedside commode. Patient drinking sprite and eating soup and crackers. No emesis recently. Have been assisting patient to use the bedpan prior to patient feeling better.

## 2021-07-21 NOTE — ED Notes (Addendum)
Patient requesting update on bed status. Patient is currently pended to stepdown at [facility]. RN called and Charge RN told this RN that there were no available admission beds on their floor. Patient's mentation is better, so reached out to admitting provider to see if potential for patient to be downgraded to telemetry level of care.Provider states that due to the patient's prolonged QTC and potential hyponatremia complications, patient will remain at stepdown level of care at this time.

## 2021-07-22 ENCOUNTER — Encounter (HOSPITAL_COMMUNITY): Payer: Self-pay | Admitting: Internal Medicine

## 2021-07-22 DIAGNOSIS — R739 Hyperglycemia, unspecified: Secondary | ICD-10-CM | POA: Diagnosis present

## 2021-07-22 DIAGNOSIS — E876 Hypokalemia: Secondary | ICD-10-CM | POA: Diagnosis present

## 2021-07-22 DIAGNOSIS — K219 Gastro-esophageal reflux disease without esophagitis: Secondary | ICD-10-CM | POA: Diagnosis present

## 2021-07-22 DIAGNOSIS — Z8249 Family history of ischemic heart disease and other diseases of the circulatory system: Secondary | ICD-10-CM | POA: Diagnosis not present

## 2021-07-22 DIAGNOSIS — R519 Headache, unspecified: Secondary | ICD-10-CM | POA: Diagnosis present

## 2021-07-22 DIAGNOSIS — Z79899 Other long term (current) drug therapy: Secondary | ICD-10-CM | POA: Diagnosis not present

## 2021-07-22 DIAGNOSIS — E871 Hypo-osmolality and hyponatremia: Secondary | ICD-10-CM | POA: Diagnosis present

## 2021-07-22 DIAGNOSIS — I10 Essential (primary) hypertension: Secondary | ICD-10-CM | POA: Diagnosis present

## 2021-07-22 DIAGNOSIS — U071 COVID-19: Secondary | ICD-10-CM | POA: Diagnosis present

## 2021-07-22 DIAGNOSIS — R9431 Abnormal electrocardiogram [ECG] [EKG]: Secondary | ICD-10-CM | POA: Diagnosis present

## 2021-07-22 DIAGNOSIS — R0902 Hypoxemia: Secondary | ICD-10-CM | POA: Diagnosis present

## 2021-07-22 DIAGNOSIS — R112 Nausea with vomiting, unspecified: Secondary | ICD-10-CM | POA: Diagnosis present

## 2021-07-22 DIAGNOSIS — F17210 Nicotine dependence, cigarettes, uncomplicated: Secondary | ICD-10-CM | POA: Diagnosis present

## 2021-07-22 DIAGNOSIS — J449 Chronic obstructive pulmonary disease, unspecified: Secondary | ICD-10-CM | POA: Diagnosis present

## 2021-07-22 DIAGNOSIS — R197 Diarrhea, unspecified: Secondary | ICD-10-CM | POA: Diagnosis present

## 2021-07-22 LAB — CBC
HCT: 40.5 % (ref 36.0–46.0)
Hemoglobin: 13.8 g/dL (ref 12.0–15.0)
MCH: 30.3 pg (ref 26.0–34.0)
MCHC: 34.1 g/dL (ref 30.0–36.0)
MCV: 89 fL (ref 80.0–100.0)
Platelets: 188 10*3/uL (ref 150–400)
RBC: 4.55 MIL/uL (ref 3.87–5.11)
RDW: 14.2 % (ref 11.5–15.5)
WBC: 7.4 10*3/uL (ref 4.0–10.5)
nRBC: 0 % (ref 0.0–0.2)

## 2021-07-22 LAB — LACTIC ACID, PLASMA
Lactic Acid, Venous: 2.1 mmol/L (ref 0.5–1.9)
Lactic Acid, Venous: 1.7 mmol/L (ref 0.5–1.9)
Lactic Acid, Venous: 0.8 mmol/L (ref 0.5–1.9)

## 2021-07-22 LAB — BASIC METABOLIC PANEL
Anion gap: 8 (ref 5–15)
BUN: 5 mg/dL — ABNORMAL LOW (ref 8–23)
CO2: 27 mmol/L (ref 22–32)
Calcium: 8.7 mg/dL — ABNORMAL LOW (ref 8.9–10.3)
Chloride: 93 mmol/L — ABNORMAL LOW (ref 98–111)
Creatinine, Ser: 0.64 mg/dL (ref 0.44–1.00)
GFR, Estimated: >60 mL/min (ref >60)
Glucose, Bld: 135 mg/dL — ABNORMAL HIGH (ref 70–99)
Potassium: 3.2 mmol/L — ABNORMAL LOW (ref 3.5–5.1)
Sodium: 128 mmol/L — ABNORMAL LOW (ref 135–145)

## 2021-07-22 LAB — URINE CULTURE: Culture: <10000 — AB

## 2021-07-22 LAB — CREATININE, SERUM
Creatinine, Ser: 0.6 mg/dL (ref 0.44–1.00)
GFR, Estimated: >60 mL/min (ref >60)

## 2021-07-22 LAB — D-DIMER, QUANTITATIVE: D-Dimer, Quant: 0.39 ug/mL-FEU (ref 0.00–0.50)

## 2021-07-22 LAB — SODIUM: Sodium: 131 mmol/L — ABNORMAL LOW (ref 135–145)

## 2021-07-22 LAB — OSMOLALITY: Osmolality: 273 mOsm/kg — ABNORMAL LOW (ref 275–295)

## 2021-07-22 LAB — C-REACTIVE PROTEIN: CRP: 8.3 mg/dL — ABNORMAL HIGH (ref <1.0)

## 2021-07-22 LAB — MRSA NEXT GEN BY PCR, NASAL: MRSA by PCR Next Gen: NOT DETECTED

## 2021-07-22 LAB — OSMOLALITY, URINE: Osmolality, Ur: 55 mOsm/kg — ABNORMAL LOW (ref 300–900)

## 2021-07-22 LAB — HIV ANTIBODY (ROUTINE TESTING W REFLEX): HIV Screen 4th Generation wRfx: NONREACTIVE

## 2021-07-22 LAB — FERRITIN: Ferritin: 175 ng/mL (ref 11–307)

## 2021-07-22 LAB — PROCALCITONIN: Procalcitonin: <0.1 ng/mL

## 2021-07-22 LAB — SODIUM, URINE, RANDOM: Sodium, Ur: <10 mmol/L

## 2021-07-22 MED ORDER — NIRMATRELVIR/RITONAVIR (PAXLOVID)TABLET
3.0000 | ORAL_TABLET | Freq: Two times a day (BID) | ORAL | Status: DC
  Administered 2021-07-22 (×2): 3 via ORAL
  Filled 2021-07-22: qty 30

## 2021-07-22 MED ORDER — ACETAMINOPHEN 325 MG PO TABS: 650.0000 mg | ORAL_TABLET | Freq: Four times a day (QID) | ORAL | Status: DC | PRN

## 2021-07-22 MED ORDER — ENOXAPARIN SODIUM 40 MG/0.4ML IJ SOSY
40.0000 mg | PREFILLED_SYRINGE | INTRAMUSCULAR | Status: DC
  Administered 2021-07-22: 40 mg via SUBCUTANEOUS

## 2021-07-22 MED ORDER — BENZONATATE 100 MG PO CAPS
200.0000 mg | ORAL_CAPSULE | Freq: Three times a day (TID) | ORAL | Status: DC
  Administered 2021-07-22 (×3): 200 mg via ORAL
  Filled 2021-07-22: qty 2

## 2021-07-22 MED ORDER — POTASSIUM CHLORIDE IN NACL 20-0.9 MEQ/L-% IV SOLN
INTRAVENOUS | Status: DC
  Filled 2021-07-22 (×3): qty 1000

## 2021-07-22 MED ORDER — ACETAMINOPHEN 500 MG PO TABS
1000.0000 mg | ORAL_TABLET | Freq: Once | ORAL | Status: AC
  Administered 2021-07-22: 1000 mg via ORAL

## 2021-07-22 MED ORDER — GUAIFENESIN-CODEINE 100-10 MG/5ML PO SOLN: 5.0000 mL | Freq: Four times a day (QID) | ORAL | Status: DC | PRN

## 2021-07-22 MED ORDER — HYDRALAZINE HCL 20 MG/ML IJ SOLN: 10.0000 mg | Freq: Four times a day (QID) | INTRAMUSCULAR | Status: DC | PRN

## 2021-07-22 MED ORDER — ACETAMINOPHEN 500 MG PO TABS
ORAL_TABLET | ORAL | Status: AC
  Filled 2021-07-22: qty 2

## 2021-07-22 MED ORDER — PROCHLORPERAZINE EDISYLATE 10 MG/2ML IJ SOLN: 10.0000 mg | Freq: Four times a day (QID) | INTRAMUSCULAR | Status: DC | PRN

## 2021-07-22 MED ORDER — ONDANSETRON HCL 4 MG/2ML IJ SOLN: 4.0000 mg | Freq: Four times a day (QID) | INTRAMUSCULAR | Status: DC | PRN

## 2021-07-22 MED ORDER — POTASSIUM CHLORIDE CRYS ER 10 MEQ PO TBCR
40.0000 meq | EXTENDED_RELEASE_TABLET | Freq: Once | ORAL | Status: AC
  Administered 2021-07-22: 40 meq via ORAL

## 2021-07-22 MED ORDER — PNEUMOCOCCAL VAC POLYVALENT 25 MCG/0.5ML IJ INJ
0.5000 mL | INJECTION | INTRAMUSCULAR | Status: DC
  Filled 2021-07-22: qty 0.5

## 2021-07-22 MED ORDER — CHLORHEXIDINE GLUCONATE CLOTH 2 % EX PADS
6.0000 | MEDICATED_PAD | Freq: Every day | CUTANEOUS | Status: DC
  Administered 2021-07-22: 6 via TOPICAL

## 2021-07-22 MED ORDER — ACETAMINOPHEN 650 MG RE SUPP: 650.0000 mg | Freq: Four times a day (QID) | RECTAL | Status: DC | PRN

## 2021-07-22 MED ORDER — ONDANSETRON HCL 4 MG PO TABS: 4.0000 mg | ORAL_TABLET | Freq: Four times a day (QID) | ORAL | Status: DC | PRN

## 2021-07-22 NOTE — ED Notes (Signed)
Patient wanting to leave AMA and agitated with the bed wait.  ED MD in to speak with patient.  Patient unable to answer MD questions.  Unclear if patient having some confusion along with her agitation. Significant other arrived and allow back to speak with patient.  Able to talk patient into staying for admission.

## 2021-07-22 NOTE — ED Notes (Signed)
[facility] at bedside 

## 2021-07-22 NOTE — H&P (Signed)
History and Physical        Hospital Admission Note Date: 07/22/2021  Patient name: Miranda [name] Medical record number: [id number] Date of birth: 12-25-1955 Age: 65 y.o. Gender: female  PCP: Carmel Sacramento NP     Patient coming from: Home  I have reviewed all records in the [facility].    Chief Complaint:  Headaches, diarrhea, vomiting, cough in the last 3 days  HPI: Patient is a 65 year old female with history of hypertension, GERD, COPD, nicotine use 1 pack/day presented to [facility] ED for 3 days of headache, nausea vomiting and diarrhea.  She reported that on Saturday, 3 days ago she noted having headaches, chills, myalgias, cough.  Subsequently next day she developed diarrhea, about 10 episodes, no hematochezia or melena.  No abdominal pain.  She did have chills but no fevers.  On Monday, the day before the admission, she developed vomiting about 4 episodes.   She was found to be COVID-positive, she had 2 vaccination, booster (in 08/2020).  States no sick contacts.  Poor appetite in the last 3 days. In ED, patient was noted to have O2 dropping to 88 to 89% on room air, at the time of my examination O2 sats 97% on room air  ED work-up/course:  Temp 97.7, respiratory rate 23-30, currently 18 on examination BP 131/63, heart rate 70, O2 sats currently 96% on room air CMET showed sodium 120 on arrival on 11/28, potassium 2.9, chloride 81, BUN 6, creatinine 0.5, LFTs normal, CBC unremarkable  Sodium has improved to 128 today, potassium 3.2   Review of Systems: Positives marked in 'bold' Constitutional: Denies fever, chills, diaphoresis, poor appetite and fatigue.  HEENT: Denies photophobia, eye pain, redness, hearing loss, ear pain, congestion, sore throat, rhinorrhea, sneezing, mouth sores, trouble swallowing, neck pain, neck stiffness and tinnitus.    Respiratory: Please see HPI Cardiovascular: Denies chest pain, palpitations and leg swelling.  Gastrointestinal: + Nausea, vomiting diarrhea, no abdominal pain. Genitourinary: Denies dysuria, urgency, frequency, hematuria, flank pain and difficulty urinating.  Musculoskeletal: Denies myalgias, back pain, joint swelling, arthralgias and gait problem.  Skin: Denies pallor, rash and wound.  Neurological:  + Generalized weakness, dizziness Hematological: Denies easy bruising, personal or family bleeding history  Psychiatric/Behavioral: Denies suicidal ideation, mood changes, confusion, nervousness, sleep disturbance and agitation  Past Medical History: Past Medical History:  Diagnosis Date   Hypertension     Past Surgical History: Past Surgical History:  Procedure Laterality Date   BREAST ENHANCEMENT SURGERY     SPINE SURGERY      Medications: Prior to Admission medications   Medication Sig Start Date End Date Taking? Authorizing Provider  COVID-19 mRNA vaccine, Pfizer, 30 MCG/0.3ML injection INJECT AS DIRECTED 09/06/20 09/06/21  Judyann Munson, MD  hydrochlorothiazide (HYDRODIURIL) 25 MG tablet TAKE 1 TABLET BY MOUTH ONCE DAILY 03/15/20 03/15/21  Courtney Paris, NP  hydrochlorothiazide (HYDRODIURIL) 25 MG tablet Take 1 tablet by mouth daily 01/09/21     lisinopril (ZESTRIL) 2.5 MG tablet TAKE 1 TABLET BY MOUTH ONCE A DAY 06/28/20 06/28/21  Courtney Paris, NP  valACYclovir (VALTREX) 1000 MG tablet Take 1,000 mg by mouth daily as needed. 08/19/18   [provider]    Allergies:  No Known Allergies  Social History:  reports that she has been smoking cigarettes. She has a 34.00 pack-year smoking history. She has never used smokeless tobacco. She reports that she does not drink alcohol and does not use drugs.  Family History: Family History  Problem Relation Age of Onset   Heart disease Father     Physical Exam: Blood pressure 131/63, pulse 83, temperature 97.7 F (36.5 C),  temperature source Oral, resp. rate 19, height 5\' 4"  (1.626 m), weight 50.4 kg, SpO2 97 %. General: Alert, awake, oriented x3, in no acute distress.   Eyes: pink conjunctiva,anicteric sclera, pupils equal and reactive to light and accomodation, HEENT: normocephalic, atraumatic, oropharynx clear, dry mucosal membranes Neck: supple, no masses or lymphadenopathy, no goiter, no bruits, no JVD CVS: Regular rate and rhythm, without murmurs, rubs or gallops. No lower extremity edema Resp : Clear to auscultation bilaterally, no wheezing, rales or rhonchi. GI : Soft, nontender, nondistended, positive bowel sounds. No hepatomegaly. No hernia.  Musculoskeletal: No clubbing or cyanosis, positive pedal pulses. No contracture. ROM intact  Neuro: Grossly intact, no focal neurological deficits, strength 5/5 upper and lower extremities bilaterally Psych: alert and oriented x 3, normal mood and affect Skin: no rashes or lesions, warm and dry   LABS on Admission: I have personally reviewed all the labs and imagings below    Basic Metabolic Panel: Recent Labs  Lab 07/21/21 1333 07/21/21 2027 07/22/21 0830  NA 120*  --  128*  K 2.9*  --  3.2*  CL 81*  --  93*  CO2 28  --  27  GLUCOSE 120*  --  135*  BUN 6*  --  5*  CREATININE 0.55  --  0.64  CALCIUM 9.5  --  8.7*  MG  --  1.8  --    Liver Function Tests: Recent Labs  Lab 07/21/21 1333  AST 17  ALT 10  ALKPHOS 94  BILITOT 0.5  PROT 6.8  ALBUMIN 4.6   Recent Labs  Lab 07/21/21 1333  LIPASE 11   No results for input(s): AMMONIA in the last 168 hours. CBC: Recent Labs  Lab 07/21/21 1333  WBC 6.4  HGB 14.4  HCT 41.5  MCV 85.6  PLT 193   Cardiac Enzymes: No results for input(s): CKTOTAL, CKMB, CKMBINDEX, TROPONINI in the last 168 hours. BNP: Invalid input(s): POCBNP CBG: No results for input(s): GLUCAP in the last 168 hours.  Radiological Exams on Admission:  CT Head Wo Contrast  Result Date: 07/21/2021 CLINICAL DATA:   Emesis, fatigue, nausea, headache. Mental status change. EXAM: CT HEAD WITHOUT CONTRAST TECHNIQUE: Contiguous axial images were obtained from the base of the skull through the vertex without intravenous contrast. COMPARISON:  None. FINDINGS: Brain: Small hypodense lesions along the posterior margins of the lentiform nuclei, dilated perivascular spaces favored over remote lacunar infarcts although a 3 by 4 mm lesion along the margin of the right anterior thalamus on image 16 of series 3 could conceivably be a small remote lacunar infarct. Periventricular white matter and corona radiata hypodensities favor chronic ischemic microvascular white matter disease. Otherwise, the brainstem, cerebellum, cerebral peduncles, thalamus, basal ganglia, basilar cisterns, and ventricular system appear within normal limits. No intracranial hemorrhage, mass lesion, or acute CVA. Vascular: Unremarkable Skull: Unremarkable Sinuses/Orbits: Unremarkable Other: No supplemental non-categorized findings. IMPRESSION: 1. No acute intracranial findings. 2. Suspected small dilated perivascular spaces adjacent to the lentiform nuclei. 3. Questionable remote lacunar infarct along  the margin of the right anterior thalamus. 4. Periventricular white matter and corona radiata hypodensities favor chronic ischemic microvascular white matter disease. Electronically Signed   By: Van Clines M.D.   On: 07/21/2021 18:53   DG Chest Portable 1 View  Result Date: 07/21/2021 CLINICAL DATA:  Cough. Positive COVID-19 test. Fatigue, vomiting, nausea and headache. Smoker. EXAM: PORTABLE CHEST 1 VIEW COMPARISON:  09/19/2009 FINDINGS: Normal sized heart. Clear lungs. Progressive hyperexpansion of the lungs with mildly progressive accentuation of the interstitial markings. Stable mild peribronchial thickening. Diffuse osteopenia. IMPRESSION: 1. No acute abnormality. 2. Progressive changes of COPD with stable chronic bronchitic changes. Electronically  Signed   By: Claudie Revering M.D.   On: 07/21/2021 16:49      EKG: Independently reviewed.  Rate 103, sinus tachycardia, LVH, QTC 518   Assessment/Plan Principal Problem: Acute hyponatremia -Likely due to nausea vomiting diarrhea, acute COVID infection, medications, on HCTZ outpatient -Hold HCTZ, placed on gentle hydration in ED, sodium improving, 128, today currently stable, alert and oriented, no mental status changes -Placed on regular diet, encourage p.o., gentle hydration with NS 60cc/hr, recheck sodium at 1300   Active Problems: Nausea, vomiting, diarrhea -Likely due to COVID, no abdominal pain or fevers -Patient reports that diarrhea has resolved, vomiting improved, wants to try solid food - will place on gentle hydration, antiemetics as needed, encourage p.o. diet    Hypokalemia -Hold HCTZ, lisinopril -Replace potassium  Acute COVID-19 virus infection -Chest x-ray with no infiltrates, did have hypoxia in ED, presenting with nausea vomiting diarrhea, headaches, cough.  Renal function, LFTs normal -Will obtain CRP, D-dimer, placed on Paxlovid per pharmacy -Continue airborne precautions  Hyperglycemia -No history of diabetes mellitus, will check hemoglobin A1c  Essential hypertension -Hold HCTZ, lisinopril, placed on hydralazine IV as needed with parameters   prolonged QTC -Avoid QTC prolonging medications, replace potassium to keep ~4, magnesium~2  DVT prophylaxis: Lovenox  CODE STATUS: Full CODE STATUS, addressed with the patient  Consults called: None  Family Communication: Admission, patients condition and plan of care including tests being ordered have been discussed with the patient who indicates understanding and agree with the plan and Code Status  Admission status:   The medical decision making on this patient was of high complexity and the patient is at high risk for clinical deterioration, therefore this is a level 3 admission.  Severity of Illness:       The appropriate patient status for this patient is INPATIENT. Inpatient status is judged to be reasonable and necessary in order to provide the required intensity of service to ensure the patient's safety. The patient's presenting symptoms, physical exam findings, and initial radiographic and laboratory data in the context of their chronic comorbidities is felt to place them at high risk for further clinical deterioration. Furthermore, it is not anticipated that the patient will be medically stable for discharge from the hospital within 2 midnights of admission.   * I certify that at the point of admission it is my clinical judgment that the patient will require inpatient hospital care spanning beyond 2 midnights from the point of admission due to high intensity of service, high risk for further deterioration and high frequency of surveillance required.*   Time Spent on Admission: 55mins      Jasai Sorg M.D. [facility] 07/22/2021, 10:55 AM

## 2021-07-22 NOTE — ED Notes (Signed)
Patient would not sign transfer form but did agree to go with [facility]

## 2021-07-22 NOTE — ED Notes (Signed)
Patient had taken off monitor leads.  States unhappy with waiting for room and wanting to leave.  Explain need for admission and risk of leaving with low sodium levels.  Given cereal and drinks.  Will contact admitting MD for orders

## 2021-07-22 NOTE — ED Notes (Signed)
Nurse in room to introduce herself. Patient sitting in bed watching TV. Patient states she is uncomfortable so nurse helps patient reposition. Patient denies any other needs at this time. Will continue to monitor.

## 2021-07-22 NOTE — ED Notes (Signed)
Patient called nurse to room. Patient states, "there is no need for me being here, y'all aren't doing anything for me. I want a room or I want to go home."  This Nurse reports to oncoming nurse to follow up with bed placement.

## 2021-07-22 NOTE — ED Notes (Signed)
Handoff report given to Marcelino Duster RN at [facility] ICU

## 2021-07-22 NOTE — ED Notes (Signed)
ED Provider at bedside. 

## 2021-07-22 NOTE — ED Notes (Signed)
Patient wanting to leave AMA.  ED MD spoke with patient .  Patient agree to stay for now for repeat blood work.

## 2021-07-22 NOTE — ED Notes (Signed)
Hand-off report given to [facility].  

## 2021-07-23 ENCOUNTER — Other Ambulatory Visit (HOSPITAL_COMMUNITY): Payer: Self-pay

## 2021-07-23 DIAGNOSIS — E871 Hypo-osmolality and hyponatremia: Secondary | ICD-10-CM | POA: Diagnosis not present

## 2021-07-23 LAB — COMPREHENSIVE METABOLIC PANEL
ALT: 14 U/L (ref 0–44)
AST: 20 U/L (ref 15–41)
Albumin: 4.1 g/dL (ref 3.5–5.0)
Alkaline Phosphatase: 84 U/L (ref 38–126)
Anion gap: 8 (ref 5–15)
BUN: 6 mg/dL — ABNORMAL LOW (ref 8–23)
CO2: 29 mmol/L (ref 22–32)
Calcium: 8.3 mg/dL — ABNORMAL LOW (ref 8.9–10.3)
Chloride: 98 mmol/L (ref 98–111)
Creatinine, Ser: 0.66 mg/dL (ref 0.44–1.00)
GFR, Estimated: >60 mL/min (ref >60)
Glucose, Bld: 85 mg/dL (ref 70–99)
Potassium: 3.8 mmol/L (ref 3.5–5.1)
Sodium: 135 mmol/L (ref 135–145)
Total Bilirubin: 0.6 mg/dL (ref 0.3–1.2)
Total Protein: 7 g/dL (ref 6.5–8.1)

## 2021-07-23 LAB — C DIFFICILE QUICK SCREEN W PCR REFLEX
C Diff antigen: NEGATIVE
C Diff interpretation: NOT DETECTED
C Diff toxin: NEGATIVE

## 2021-07-23 LAB — CBC
HCT: 42.7 % (ref 36.0–46.0)
Hemoglobin: 14 g/dL (ref 12.0–15.0)
MCH: 29.5 pg (ref 26.0–34.0)
MCHC: 32.8 g/dL (ref 30.0–36.0)
MCV: 90.1 fL (ref 80.0–100.0)
Platelets: 205 10*3/uL (ref 150–400)
RBC: 4.74 MIL/uL (ref 3.87–5.11)
RDW: 14.4 % (ref 11.5–15.5)
WBC: 6.7 10*3/uL (ref 4.0–10.5)
nRBC: 0 % (ref 0.0–0.2)

## 2021-07-23 LAB — C-REACTIVE PROTEIN: CRP: 8.7 mg/dL — ABNORMAL HIGH (ref <1.0)

## 2021-07-23 LAB — D-DIMER, QUANTITATIVE: D-Dimer, Quant: 0.53 ug/mL-FEU — ABNORMAL HIGH (ref 0.00–0.50)

## 2021-07-23 MED ORDER — NIRMATRELVIR/RITONAVIR (PAXLOVID)TABLET: 3.0000 | ORAL_TABLET | Freq: Two times a day (BID) | ORAL | 0 refills | Status: AC

## 2021-07-23 MED ORDER — AMLODIPINE BESYLATE 5 MG PO TABS
5.0000 mg | ORAL_TABLET | Freq: Every day | ORAL | Status: DC
Start: 2021-07-23 — End: 2021-07-23
  Administered 2021-07-23: 5 mg via ORAL
  Filled 2021-07-23: qty 1

## 2021-07-23 MED ORDER — AMLODIPINE BESYLATE 5 MG PO TABS
5.0000 mg | ORAL_TABLET | Freq: Every day | ORAL | 0 refills | Status: DC
  Filled 2021-07-23: qty 30, 30d supply, fill #0

## 2021-07-23 NOTE — Consult Note (Signed)
   [facility] Inpatient Consult   07/23/2021  Justine Dines Feb 06, 1956 [id number]  [facility] [[facility]]  Accountable Care Organization [ACO] Patient: [place] plan   Primary Care Provider:  Pcp, No  Patient has been currently assigned to a [facility] RN Care Management for telephonic chronic disease management services. Call attempts to patient at [facility].   Referral for [facility] plan for post hospital follow up and community resource support.      Plan: Patient will be followed by [facility] RN Care Coordinator.   For additional questions or referrals please contact:   Charlesetta Shanks, RN BSN CCM [facility]  [phone] business mobile phone Toll free office [phone]  Fax number: [phone] [email or web address]

## 2021-07-23 NOTE — Discharge Summary (Signed)
Physician Discharge Summary  Priscella Donna RXV:[id number] DOB: [birth year] DOA: 07/21/2021  PCP: Pcp, No  Admit date: 07/21/2021 Discharge date: 07/23/2021  Admitted From: Home Disposition:  Home  Discharge Condition:Stable CODE STATUS:FULL Diet recommendation: Heart Healthy   Brief/Interim Summary:  Patient is a 65 year old female with history of hypertension, GERD, COPD, nicotine use 1 pack/day presented to [facility] ED for 3 days of headache, nausea vomiting and diarrhea.  She reported that on Saturday, 3 days ago she noted having headaches, chills, myalgias, cough.  Subsequently next day she developed diarrhea, about 10 episodes, no hematochezia or melena.  No abdominal pain.  She did have chills but no fevers.  She also developed vomiting about 4 episodes.   She was found to be COVID-positive, she had 2 vaccination, booster (in 08/2020). In ED, patient was noted to have O2 dropping to 88 to 89% on room air, at the time of my examination O2 sats 97% on room air.  Patient was admitted for the management of COVID illness, hyponatremia.  Today her respiratory status is stable, she is on room air.  She desperately wants to go home.  She is medically stable for discharge.  Following problems were addressed during her hospitalization:  Acute hyponatremia -Likely due to nausea vomiting diarrhea, acute COVID infection, medications, on HCTZ outpatient -Resolved -HCTZ d/ced   Nausea, vomiting, diarrhea -Likely due to COVID, no abdominal pain or fevers -Resolved     Hypokalemia -Replace potassium   Acute COVID-19 virus infection -Chest x-ray with no infiltrates, did have hypoxia in ED, presenting with nausea vomiting diarrhea, headaches, cough.  Renal function, LFTs normal -We recommend  Paxlovid for next 4 days that she can take at home   Hyperglycemia -No history of diabetes mellitus, pending hemoglobin A1c   Essential hypertension -Hold HCTZ,she states she had been  prescribed lisinopril but not taking. -Started on amlodipine     Discharge Diagnoses:  Principal Problem:   Hyponatremia Active Problems:   GERD   Hypokalemia   COVID-19 virus infection    Discharge Instructions  Discharge Instructions     Diet - low sodium heart healthy   Complete by: As directed    Discharge instructions   Complete by: As directed    1)Please take prescribed medications as instructed 2)Monitor your blood pressure at home 3)Isolate yourself for the next 5 days   Increase activity slowly   Complete by: As directed       Allergies as of 07/23/2021   No Known Allergies      Medication List     STOP taking these medications    hydrochlorothiazide 25 MG tablet Commonly known as: HYDRODIURIL   lisinopril 2.5 MG tablet Commonly known as: ZESTRIL       TAKE these medications    acetaminophen 500 MG tablet Commonly known as: TYLENOL Take 500-1,000 mg by mouth every 6 (six) hours as needed for mild pain, fever or headache.   amLODipine 5 MG tablet Commonly known as: NORVASC Take 1 tablet (5 mg total) by mouth daily. Start taking on: July 24, 2021   ibuprofen 200 MG tablet Commonly known as: ADVIL Take 400-600 mg by mouth every 6 (six) hours as needed for fever, headache or mild pain.   nirmatrelvir/ritonavir EUA 20 x 150 MG & 10 x 100MG  Tabs Commonly known as: PAXLOVID Take 3 tablets by mouth 2 (two) times daily for 4 days. Take nirmatrelvir (150 mg) two tablets twice daily for 5 days and ritonavir (100 mg)  one tablet twice daily for 4 days.   Pfizer-BioNTech COVID-19 Vacc 30 MCG/0.3ML injection Generic drug: COVID-19 mRNA vaccine (Pfizer) INJECT AS DIRECTED        No Known Allergies  Consultations: None   Procedures/Studies: CT Head Wo Contrast  Result Date: 07/21/2021 CLINICAL DATA:  Emesis, fatigue, nausea, headache. Mental status change. EXAM: CT HEAD WITHOUT CONTRAST TECHNIQUE: Contiguous axial images were obtained  from the base of the skull through the vertex without intravenous contrast. COMPARISON:  None. FINDINGS: Brain: Small hypodense lesions along the posterior margins of the lentiform nuclei, dilated perivascular spaces favored over remote lacunar infarcts although a 3 by 4 mm lesion along the margin of the right anterior thalamus on image 16 of series 3 could conceivably be a small remote lacunar infarct. Periventricular white matter and corona radiata hypodensities favor chronic ischemic microvascular white matter disease. Otherwise, the brainstem, cerebellum, cerebral peduncles, thalamus, basal ganglia, basilar cisterns, and ventricular system appear within normal limits. No intracranial hemorrhage, mass lesion, or acute CVA. Vascular: Unremarkable Skull: Unremarkable Sinuses/Orbits: Unremarkable Other: No supplemental non-categorized findings. IMPRESSION: 1. No acute intracranial findings. 2. Suspected small dilated perivascular spaces adjacent to the lentiform nuclei. 3. Questionable remote lacunar infarct along the margin of the right anterior thalamus. 4. Periventricular white matter and corona radiata hypodensities favor chronic ischemic microvascular white matter disease. Electronically Signed   By: Gaylyn Rong M.D.   On: 07/21/2021 18:53   DG Chest Portable 1 View  Result Date: 07/21/2021 CLINICAL DATA:  Cough. Positive COVID-19 test. Fatigue, vomiting, nausea and headache. Smoker. EXAM: PORTABLE CHEST 1 VIEW COMPARISON:  09/19/2009 FINDINGS: Normal sized heart. Clear lungs. Progressive hyperexpansion of the lungs with mildly progressive accentuation of the interstitial markings. Stable mild peribronchial thickening. Diffuse osteopenia. IMPRESSION: 1. No acute abnormality. 2. Progressive changes of COPD with stable chronic bronchitic changes. Electronically Signed   By: Beckie Salts M.D.   On: 07/21/2021 16:49      Subjective: Patient seen and examined the bedside this morning.   Hemodynamically stable for discharge today.  Discharge Exam: Vitals:   07/23/21 0600 07/23/21 0800  BP: (!) 147/122 123/71  Pulse: 79 74  Resp: 18 14  Temp:  98 F (36.7 C)  SpO2: 96% 96%   Vitals:   07/23/21 0300 07/23/21 0400 07/23/21 0600 07/23/21 0800  BP:  (!) 168/84 (!) 147/122 123/71  Pulse: 72 78 79 74  Resp: 18 17 18 14   Temp: 98.1 F (36.7 C)   98 F (36.7 C)  TempSrc: Axillary   Axillary  SpO2: 96% 96% 96% 96%  Weight:      Height:        General: Pt is alert, awake, not in acute distress Cardiovascular: RRR, S1/S2 +, no rubs, no gallops Respiratory: CTA bilaterally, no wheezing, no rhonchi Abdominal: Soft, NT, ND, bowel sounds + Extremities: no edema, no cyanosis    The results of significant diagnostics from this hospitalization (including imaging, microbiology, ancillary and laboratory) are listed below for reference.     Microbiology: Recent Results (from the past 240 hour(s))  Resp Panel by RT-PCR (Flu A&B, Covid) Nasopharyngeal Swab     Status: Abnormal   Collection Time: 07/21/21  1:34 PM   Specimen: Nasopharyngeal Swab; Nasopharyngeal(NP) swabs in vial transport medium  Result Value Ref Range Status   SARS Coronavirus 2 by RT PCR POSITIVE (A) NEGATIVE Final    Comment: RESULT CALLED TO, READ BACK BY AND VERIFIED WITH: PATTY DOWD, RN 1435 [facility] 07/21/21 (  NOTE) SARS-CoV-2 target nucleic acids are DETECTED.  The SARS-CoV-2 RNA is generally detectable in upper respiratory specimens during the acute phase of infection. Positive results are indicative of the presence of the identified virus, but do not rule out bacterial infection or co-infection with other pathogens not detected by the test. Clinical correlation with patient history and other diagnostic information is necessary to determine patient infection status. The expected result is Negative.  Fact Sheet for Patients: BloggerCourse.com  Fact Sheet for Healthcare  Providers: SeriousBroker.it  This test is not yet approved or cleared by the [place] FDA and  has been authorized for detection and/or diagnosis of SARS-CoV-2 by FDA under an Emergency Use Authorization (EUA).  This EUA will remain in effect (meaning this test can be  used) for the duration of  the COVID-19 declaration under Section 564(b)(1) of the Act, 21 U.S.C. section 360bbb-3(b)(1), unless the authorization is terminated or revoked sooner.     Influenza A by PCR NEGATIVE NEGATIVE Final   Influenza B by PCR NEGATIVE NEGATIVE Final    Comment: (NOTE) The Xpert Xpress SARS-CoV-2/FLU/RSV plus assay is intended as an aid in the diagnosis of influenza from Nasopharyngeal swab specimens and should not be used as a sole basis for treatment. Nasal washings and aspirates are unacceptable for Xpert Xpress SARS-CoV-2/FLU/RSV testing.  Fact Sheet for Patients: BloggerCourse.com  Fact Sheet for Healthcare Providers: SeriousBroker.it  This test is not yet approved or cleared by the [place] FDA and has been authorized for detection and/or diagnosis of SARS-CoV-2 by FDA under an Emergency Use Authorization (EUA). This EUA will remain in effect (meaning this test can be used) for the duration of the COVID-19 declaration under Section 564(b)(1) of the Act, 21 U.S.C. section 360bbb-3(b)(1), unless the authorization is terminated or revoked.  Performed at [facility], [address] [place], [place] [address]   Urine Culture     Status: Abnormal   Collection Time: 07/21/21  4:28 PM   Specimen: Urine, Clean Catch  Result Value Ref Range Status   Specimen Description   Final    URINE, CLEAN CATCH Performed at [facility], [address] [place], [place] [address]    Special Requests   Final    NONE Performed at [facility], [address] [place], [place] [address]    Culture (A)  Final    <10,000 COLONIES/mL INSIGNIFICANT GROWTH Performed at [facility], [address], [place], [place] [address]    Report Status 07/22/2021 FINAL  Final  MRSA Next Gen by PCR, Nasal     Status: None   Collection Time: 07/22/21 10:30 AM   Specimen: Nasal Mucosa; Nasal Swab  Result Value Ref Range Status   MRSA by PCR Next Gen NOT DETECTED NOT DETECTED Final    Comment: (NOTE) The GeneXpert MRSA Assay (FDA approved for NASAL specimens only), is one component of a comprehensive MRSA colonization surveillance program. It is not intended to diagnose MRSA infection nor to guide or monitor treatment for MRSA infections. Test performance is not FDA approved in patients less than 69 years old. Performed at [facility], [address] [place], [place] [address]   C Difficile Quick Screen w PCR reflex     Status: None   Collection Time: 07/23/21  8:21 AM   Specimen: STOOL  Result Value Ref Range Status   C Diff antigen NEGATIVE NEGATIVE Final   C Diff toxin NEGATIVE NEGATIVE Final  C Diff interpretation No C. difficile detected.  Final    Comment: Performed at [facility], [address] [place], [place] [address]     Labs: BNP (last 3 results) No results for input(s): BNP in the last 8760 hours. Basic Metabolic Panel: Recent Labs  Lab 07/21/21 1333 07/21/21 2027 07/22/21 0830 07/22/21 1328 07/23/21 0235  NA 120*  --  128* 131* 135  K 2.9*  --  3.2*  --  3.8  CL 81*  --  93*  --  98  CO2 28  --  27  --  29  GLUCOSE 120*  --  135*  --  85  BUN 6*  --  5*  --  6*  CREATININE 0.55  --  0.64 0.60 0.66  CALCIUM 9.5  --  8.7*  --  8.3*  MG  --  1.8  --   --   --    Liver Function Tests: Recent Labs  Lab 07/21/21 1333 07/23/21 0235  AST 17 20  ALT 10 14  ALKPHOS 94 84  BILITOT 0.5 0.6  PROT 6.8 7.0  ALBUMIN 4.6 4.1   Recent Labs  Lab 07/21/21 1333  LIPASE 11   No  results for input(s): AMMONIA in the last 168 hours. CBC: Recent Labs  Lab 07/21/21 1333 07/22/21 1328 07/23/21 0235  WBC 6.4 7.4 6.7  HGB 14.4 13.8 14.0  HCT 41.5 40.5 42.7  MCV 85.6 89.0 90.1  PLT 193 188 205   Cardiac Enzymes: No results for input(s): CKTOTAL, CKMB, CKMBINDEX, TROPONINI in the last 168 hours. BNP: Invalid input(s): POCBNP CBG: No results for input(s): GLUCAP in the last 168 hours. D-Dimer Recent Labs    07/22/21 1328 07/23/21 0235  DDIMER 0.39 0.53*   Hgb A1c No results for input(s): HGBA1C in the last 72 hours. Lipid Profile No results for input(s): CHOL, HDL, LDLCALC, TRIG, CHOLHDL, LDLDIRECT in the last 72 hours. Thyroid function studies No results for input(s): TSH, T4TOTAL, T3FREE, THYROIDAB in the last 72 hours.  Invalid input(s): FREET3 Anemia work up Recent Labs    07/22/21 1328  FERRITIN 175   Urinalysis    Component Value Date/Time   COLORURINE COLORLESS (A) 07/21/2021 1615   APPEARANCEUR CLEAR 07/21/2021 1615   LABSPEC 1.010 07/21/2021 1615   PHURINE 6.5 07/21/2021 1615   GLUCOSEU NEGATIVE 07/21/2021 1615   GLUCOSEU NEGATIVE 10/13/2006 1005   HGBUR MODERATE (A) 07/21/2021 1615   BILIRUBINUR NEGATIVE 07/21/2021 1615   KETONESUR 15 (A) 07/21/2021 1615   PROTEINUR NEGATIVE 07/21/2021 1615   UROBILINOGEN 0.2 05/22/2009 1200   NITRITE NEGATIVE 07/21/2021 1615   LEUKOCYTESUR NEGATIVE 07/21/2021 1615   Sepsis Labs Invalid input(s): PROCALCITONIN,  WBC,  LACTICIDVEN Microbiology Recent Results (from the past 240 hour(s))  Resp Panel by RT-PCR (Flu A&B, Covid) Nasopharyngeal Swab     Status: Abnormal   Collection Time: 07/21/21  1:34 PM   Specimen: Nasopharyngeal Swab; Nasopharyngeal(NP) swabs in vial transport medium  Result Value Ref Range Status   SARS Coronavirus 2 by RT PCR POSITIVE (A) NEGATIVE Final    Comment: RESULT CALLED TO, READ BACK BY AND VERIFIED WITH: PATTY DOWD, RN 1435 [facility] 07/21/21 (NOTE) SARS-CoV-2 target  nucleic acids are DETECTED.  The SARS-CoV-2 RNA is generally detectable in upper respiratory specimens during the acute phase of infection. Positive results are indicative of the presence of the identified virus, but do not rule out bacterial infection or co-infection with other pathogens not detected by  the test. Clinical correlation with patient history and other diagnostic information is necessary to determine patient infection status. The expected result is Negative.  Fact Sheet for Patients: BloggerCourse.com  Fact Sheet for Healthcare Providers: SeriousBroker.it  This test is not yet approved or cleared by the [place] FDA and  has been authorized for detection and/or diagnosis of SARS-CoV-2 by FDA under an Emergency Use Authorization (EUA).  This EUA will remain in effect (meaning this test can be  used) for the duration of  the COVID-19 declaration under Section 564(b)(1) of the Act, 21 U.S.C. section 360bbb-3(b)(1), unless the authorization is terminated or revoked sooner.     Influenza A by PCR NEGATIVE NEGATIVE Final   Influenza B by PCR NEGATIVE NEGATIVE Final    Comment: (NOTE) The Xpert Xpress SARS-CoV-2/FLU/RSV plus assay is intended as an aid in the diagnosis of influenza from Nasopharyngeal swab specimens and should not be used as a sole basis for treatment. Nasal washings and aspirates are unacceptable for Xpert Xpress SARS-CoV-2/FLU/RSV testing.  Fact Sheet for Patients: BloggerCourse.com  Fact Sheet for Healthcare Providers: SeriousBroker.it  This test is not yet approved or cleared by the [place] FDA and has been authorized for detection and/or diagnosis of SARS-CoV-2 by FDA under an Emergency Use Authorization (EUA). This EUA will remain in effect (meaning this test can be used) for the duration of the COVID-19 declaration under Section  564(b)(1) of the Act, 21 U.S.C. section 360bbb-3(b)(1), unless the authorization is terminated or revoked.  Performed at [facility], [address] [place], [place] [address]   Urine Culture     Status: Abnormal   Collection Time: 07/21/21  4:28 PM   Specimen: Urine, Clean Catch  Result Value Ref Range Status   Specimen Description   Final    URINE, CLEAN CATCH Performed at [facility], [address] [place], [place] [address]    Special Requests   Final    NONE Performed at [facility], [address] [place], [place] [address]    Culture (A)  Final    <10,000 COLONIES/mL INSIGNIFICANT GROWTH Performed at [facility], [address] [place], [place] [address]    Report Status 07/22/2021 FINAL  Final  MRSA Next Gen by PCR, Nasal     Status: None   Collection Time: 07/22/21 10:30 AM   Specimen: Nasal Mucosa; Nasal Swab  Result Value Ref Range Status   MRSA by PCR Next Gen NOT DETECTED NOT DETECTED Final    Comment: (NOTE) The GeneXpert MRSA Assay (FDA approved for NASAL specimens only), is one component of a comprehensive MRSA colonization surveillance program. It is not intended to diagnose MRSA infection nor to guide or monitor treatment for MRSA infections. Test performance is not FDA approved in patients less than 21 years old. Performed at [facility], [address], [place], [place] [address]   C Difficile Quick Screen w PCR reflex     Status: None   Collection Time: 07/23/21  8:21 AM   Specimen: STOOL  Result Value Ref Range Status   C Diff antigen NEGATIVE NEGATIVE Final   C Diff toxin NEGATIVE NEGATIVE Final   C Diff interpretation No C. difficile detected.  Final    Comment: Performed at [facility], [address], [place], [place] [address]    Please note: You were cared for by a hospitalist during your hospital stay. Once you are discharged,  your primary care  physician will handle any further medical issues. Please note that NO REFILLS for any discharge medications will be authorized once you are discharged, as it is imperative that you return to your primary care physician (or establish a relationship with a primary care physician if you do not have one) for your post hospital discharge needs so that they can reassess your need for medications and monitor your lab values.    Time coordinating discharge: 40 minutes  SIGNED:   Burnadette Pop, MD  [facility] 07/23/2021, 10:16 AM Pager [phone]  If 7PM-7AM, please contact night-coverage [email or web address] Password [email or web address]

## 2021-07-23 NOTE — Progress Notes (Signed)
Discharge instructions provided to and discussed with patient. All questions answered. IV removed. Pt wheeled down by RN to main entrance at 1045.

## 2021-07-25 LAB — HEMOGLOBIN A1C
Hgb A1c MFr Bld: 5.3 % (ref 4.8–5.6)
Mean Plasma Glucose: 105 mg/dL

## 2021-07-28 ENCOUNTER — Other Ambulatory Visit: Payer: Self-pay | Admitting: *Deleted

## 2021-07-28 NOTE — Patient Outreach (Signed)
[facility]) Care Management  07/28/2021  Miranda [name] [birth year] [id number]   Referral received: 11/30 Initial outreach: 12/5 Surgery/procedure date: N/A Insurance: [place] Focus/Choice/Save Plan    Initial unsuccessful telephone call to patient's preferred number in order to complete transition of care assessment; no answer, left HIPAA compliant voicemail message requesting return call.   Objective: Per the electronic medical record, Miranda [name]  was hospitalized at [facility] from 11/28-11/30 with/for Covid complications. Comorbidities include: GERD and HTN.  She was discharged to home on 11/30 without the need for home health services or durable medical equipment per the discharge summary.    Plan: If no return call from patient by the end of business day today, this RNCM will route unsuccessful outreach letter with [facility] Care Management pamphlet and 24 hour Nurse Advice Line Magnet to [facility] Care Management clinical pool to be mailed to patient's home address. This RNCM will attempt another outreach within 4 business days.  Kemper Durie, [place], MSN [facility] Care Management  [facility] Manager [phone]

## 2021-07-31 ENCOUNTER — Other Ambulatory Visit: Payer: Self-pay | Admitting: *Deleted

## 2021-07-31 NOTE — Patient Outreach (Signed)
[facility]) Care Management  07/31/2021  Miranda [name] May 20, 1956 [id number]   Referral received: 11/30 Initial outreach: 12/5 Surgery/procedure date: N/A Insurance: [place] Focus/Choice/Save Plan       Second telephone call to patient's preferred number in order to complete transition of care assessment, report this is not a good time to talk, request call back tomorrow.   Objective: Per the electronic medical record, Miranda [name]  was hospitalized at [facility] from 11/28-11/30 with/for Covid complications. Comorbidities include: GERD and HTN.  She was discharged to home on 11/30 without the need for home health services or durable medical equipment per the discharge summary.      Plan: This RNCM will attempt another outreach within 4 business days.  Kemper Durie, [place], MSN [facility] Care Management  [facility] Manager [phone]

## 2021-08-01 ENCOUNTER — Other Ambulatory Visit: Payer: Self-pay | Admitting: *Deleted

## 2021-08-01 ENCOUNTER — Other Ambulatory Visit (HOSPITAL_COMMUNITY): Payer: Self-pay

## 2021-08-01 DIAGNOSIS — J439 Emphysema, unspecified: Secondary | ICD-10-CM | POA: Diagnosis not present

## 2021-08-01 DIAGNOSIS — J441 Chronic obstructive pulmonary disease with (acute) exacerbation: Secondary | ICD-10-CM | POA: Diagnosis not present

## 2021-08-01 DIAGNOSIS — R030 Elevated blood-pressure reading, without diagnosis of hypertension: Secondary | ICD-10-CM | POA: Diagnosis not present

## 2021-08-01 DIAGNOSIS — I10 Essential (primary) hypertension: Secondary | ICD-10-CM | POA: Diagnosis not present

## 2021-08-01 MED ORDER — AMLODIPINE BESYLATE 2.5 MG PO TABS
2.5000 mg | ORAL_TABLET | Freq: Every day | ORAL | 1 refills | Status: DC
  Filled 2021-08-01: qty 90, 90d supply, fill #0
  Filled 2021-12-12: qty 90, 90d supply, fill #1

## 2021-08-01 NOTE — Patient Outreach (Signed)
[facility]) Care Management  08/01/2021  Miranda [name] [birth year] [id number]   Referral received: 11/30 Initial outreach: 12/5 Surgery/procedure date: N/A Insurance: [place] Focus/Choice/Save Plan       Third telephone call to patient's preferred number in order to complete transition of care assessment, unsuccessful, HIPAA compliant voice message left.    Objective: Per the electronic medical record, Miranda [name]  was hospitalized at [facility] from 11/28-11/30 with/for Covid complications. Comorbidities include: GERD and HTN.  She was discharged to home on 11/30 without the need for home health services or durable medical equipment per the discharge summary.      Plan: Will make 4th and final attempt within the next 4 weeks, if remain unsuccessful will close case due to inability to maintain contact.  Kemper Durie, [place], MSN [facility] Care Management  [facility] Manager [phone]

## 2021-08-21 ENCOUNTER — Other Ambulatory Visit: Payer: Self-pay | Admitting: *Deleted

## 2021-08-21 NOTE — Patient Outreach (Signed)
[facility]) Care Management  08/21/2021  Miranda [name] September 28, 1955 [id number]   Outreach attempt #4, unsuccessful, HIPAA compliant voice message left.  No response from member after multiple unsuccessful outreach attempts and letter sent.  Will close case at this time due to inability to maintain contact.   Kemper Durie, RN, MSN, CCM [facility] Care Management  [facility] Manager [phone]

## 2021-12-12 ENCOUNTER — Other Ambulatory Visit (HOSPITAL_COMMUNITY): Payer: Self-pay

## 2022-04-01 DIAGNOSIS — J439 Emphysema, unspecified: Secondary | ICD-10-CM | POA: Diagnosis not present

## 2022-04-01 DIAGNOSIS — R030 Elevated blood-pressure reading, without diagnosis of hypertension: Secondary | ICD-10-CM | POA: Diagnosis not present

## 2022-04-01 DIAGNOSIS — I10 Essential (primary) hypertension: Secondary | ICD-10-CM | POA: Diagnosis not present

## 2022-04-01 DIAGNOSIS — Z681 Body mass index (BMI) 19 or less, adult: Secondary | ICD-10-CM | POA: Diagnosis not present

## 2022-04-02 ENCOUNTER — Other Ambulatory Visit (HOSPITAL_COMMUNITY): Payer: Self-pay

## 2022-04-02 MED ORDER — AMLODIPINE BESYLATE 5 MG PO TABS
5.0000 mg | ORAL_TABLET | Freq: Every day | ORAL | 3 refills | Status: DC
  Filled 2022-04-02: qty 90, 90d supply, fill #0
  Filled 2022-07-08: qty 90, 90d supply, fill #1
  Filled 2022-10-15: qty 90, 90d supply, fill #2
  Filled 2023-01-27: qty 90, 90d supply, fill #3

## 2022-07-08 ENCOUNTER — Other Ambulatory Visit (HOSPITAL_COMMUNITY): Payer: Self-pay

## 2022-10-15 ENCOUNTER — Other Ambulatory Visit (HOSPITAL_COMMUNITY): Payer: Self-pay

## 2023-01-28 ENCOUNTER — Other Ambulatory Visit (HOSPITAL_COMMUNITY): Payer: Self-pay

## 2023-04-30 ENCOUNTER — Other Ambulatory Visit (HOSPITAL_COMMUNITY): Payer: Self-pay

## 2023-04-30 ENCOUNTER — Other Ambulatory Visit (HOSPITAL_BASED_OUTPATIENT_CLINIC_OR_DEPARTMENT_OTHER): Payer: Self-pay

## 2023-04-30 DIAGNOSIS — Z1231 Encounter for screening mammogram for malignant neoplasm of breast: Secondary | ICD-10-CM | POA: Diagnosis not present

## 2023-04-30 DIAGNOSIS — Z0000 Encounter for general adult medical examination without abnormal findings: Secondary | ICD-10-CM | POA: Diagnosis not present

## 2023-04-30 DIAGNOSIS — Z1211 Encounter for screening for malignant neoplasm of colon: Secondary | ICD-10-CM | POA: Diagnosis not present

## 2023-04-30 DIAGNOSIS — F17200 Nicotine dependence, unspecified, uncomplicated: Secondary | ICD-10-CM | POA: Diagnosis not present

## 2023-04-30 DIAGNOSIS — M810 Age-related osteoporosis without current pathological fracture: Secondary | ICD-10-CM | POA: Diagnosis not present

## 2023-04-30 DIAGNOSIS — Z124 Encounter for screening for malignant neoplasm of cervix: Secondary | ICD-10-CM | POA: Diagnosis not present

## 2023-04-30 DIAGNOSIS — I10 Essential (primary) hypertension: Secondary | ICD-10-CM | POA: Diagnosis not present

## 2023-04-30 MED ORDER — AMLODIPINE BESYLATE 10 MG PO TABS
10.0000 mg | ORAL_TABLET | Freq: Every day | ORAL | 1 refills | Status: DC
  Filled 2023-04-30: qty 30, 30d supply, fill #0
  Filled 2023-06-10: qty 30, 30d supply, fill #1

## 2023-05-06 ENCOUNTER — Other Ambulatory Visit (HOSPITAL_BASED_OUTPATIENT_CLINIC_OR_DEPARTMENT_OTHER): Payer: Self-pay

## 2023-06-11 ENCOUNTER — Other Ambulatory Visit (HOSPITAL_COMMUNITY): Payer: Self-pay

## 2023-06-11 DIAGNOSIS — H524 Presbyopia: Secondary | ICD-10-CM | POA: Diagnosis not present

## 2023-06-11 DIAGNOSIS — H43393 Other vitreous opacities, bilateral: Secondary | ICD-10-CM | POA: Diagnosis not present

## 2023-06-18 ENCOUNTER — Other Ambulatory Visit (HOSPITAL_COMMUNITY): Payer: Self-pay

## 2023-06-18 DIAGNOSIS — I10 Essential (primary) hypertension: Secondary | ICD-10-CM | POA: Diagnosis not present

## 2023-06-18 MED ORDER — AMLODIPINE BESYLATE 10 MG PO TABS
10.0000 mg | ORAL_TABLET | Freq: Every day | ORAL | 3 refills | Status: DC
  Filled 2023-06-18 – 2023-07-19 (×2): qty 90, 90d supply, fill #0
  Filled 2023-10-21: qty 90, 90d supply, fill #1
  Filled 2024-02-04: qty 90, 90d supply, fill #2

## 2023-07-19 ENCOUNTER — Other Ambulatory Visit (HOSPITAL_COMMUNITY): Payer: Self-pay

## 2023-09-24 ENCOUNTER — Other Ambulatory Visit (HOSPITAL_COMMUNITY): Payer: Self-pay

## 2024-02-04 ENCOUNTER — Other Ambulatory Visit (HOSPITAL_COMMUNITY): Payer: Self-pay

## 2024-02-04 DIAGNOSIS — E559 Vitamin D deficiency, unspecified: Secondary | ICD-10-CM | POA: Diagnosis not present

## 2024-02-04 DIAGNOSIS — N3281 Overactive bladder: Secondary | ICD-10-CM | POA: Diagnosis not present

## 2024-02-04 DIAGNOSIS — R636 Underweight: Secondary | ICD-10-CM | POA: Diagnosis not present

## 2024-02-04 DIAGNOSIS — Z681 Body mass index (BMI) 19 or less, adult: Secondary | ICD-10-CM | POA: Diagnosis not present

## 2024-02-04 DIAGNOSIS — I10 Essential (primary) hypertension: Secondary | ICD-10-CM | POA: Diagnosis not present

## 2024-02-04 MED ORDER — VITAMIN D3 1.25 MG (50000 UT) PO CAPS
ORAL_CAPSULE | ORAL | 1 refills | Status: AC
  Filled 2024-02-04: qty 13, 90d supply, fill #0

## 2024-02-04 MED ORDER — AMLODIPINE BESYLATE 10 MG PO TABS
10.0000 mg | ORAL_TABLET | Freq: Every day | ORAL | 3 refills | Status: AC
  Filled 2024-02-04: qty 90, 90d supply, fill #0
  Filled 2024-05-19: qty 90, 90d supply, fill #1
  Filled 2024-09-07: qty 90, 90d supply, fill #2

## 2024-06-24 ENCOUNTER — Other Ambulatory Visit (HOSPITAL_COMMUNITY): Payer: Self-pay

## 2024-06-24 ENCOUNTER — Ambulatory Visit (HOSPITAL_COMMUNITY)
Admission: EM | Admit: 2024-06-24 | Discharge: 2024-06-24 | Disposition: A | Discharge status: Home / Self Care | Attending: Family Medicine | Admitting: Family Medicine

## 2024-06-24 ENCOUNTER — Ambulatory Visit (INDEPENDENT_AMBULATORY_CARE_PROVIDER_SITE_OTHER)

## 2024-06-24 ENCOUNTER — Encounter (HOSPITAL_COMMUNITY): Payer: Self-pay

## 2024-06-24 DIAGNOSIS — R062 Wheezing: Secondary | ICD-10-CM

## 2024-06-24 DIAGNOSIS — R053 Chronic cough: Secondary | ICD-10-CM

## 2024-06-24 DIAGNOSIS — R918 Other nonspecific abnormal finding of lung field: Secondary | ICD-10-CM | POA: Diagnosis not present

## 2024-06-24 MED ORDER — BENZONATATE 100 MG PO CAPS
100.0000 mg | ORAL_CAPSULE | Freq: Three times a day (TID) | ORAL | 0 refills | Status: DC | PRN
  Filled 2024-06-24: qty 21, 7d supply, fill #0

## 2024-06-24 MED ORDER — DEXAMETHASONE SOD PHOSPHATE PF 10 MG/ML IJ SOLN
10.0000 mg | Freq: Once | INTRAMUSCULAR | Status: AC
  Administered 2024-06-24: 10 mg via INTRAMUSCULAR

## 2024-06-24 MED ORDER — AZITHROMYCIN 250 MG PO TABS
250.0000 mg | ORAL_TABLET | Freq: Every day | ORAL | 0 refills | Status: DC
  Filled 2024-06-24: qty 6, 5d supply, fill #0

## 2024-06-24 MED ORDER — ALBUTEROL SULFATE HFA 108 (90 BASE) MCG/ACT IN AERS
1.0000 | INHALATION_SPRAY | Freq: Four times a day (QID) | RESPIRATORY_TRACT | 1 refills | Status: AC | PRN
  Filled 2024-06-24: qty 6.7, 25d supply, fill #0

## 2024-06-24 NOTE — Discharge Instructions (Addendum)
 Schedule a follow up appt with your primary provider.  Meds ordered this encounter  Medications   dexamethasone (DECADRON) injection 10 mg   benzonatate  (TESSALON ) 100 MG capsule    Sig: Take 1 capsule by mouth every 8 (eight) hours for cough.    Dispense:  21 capsule    Refill:  0   azithromycin  (ZITHROMAX ) 250 MG tablet    Sig: Take 1 tablet (250 mg total) by mouth daily. Take first 2 tablets together, then 1 every day until finished.    Dispense:  6 tablet    Refill:  0   albuterol (VENTOLIN HFA) 108 (90 Base) MCG/ACT inhaler    Sig: Inhale 1-2 puffs into the lungs every 6 (six) hours as needed for wheezing or shortness of breath.    Dispense:  1 each    Refill:  1

## 2024-06-24 NOTE — ED Triage Notes (Signed)
 Pt states cough and congestion for the past 3 weeks.  States she has taken OTC cold medicine with no relief. States she is coughing up yellow phlegm.

## 2024-06-24 NOTE — ED Provider Notes (Signed)
 [facility]   [id number] 06/24/24 Arrival Time: 1003  ASSESSMENT & PLAN:  1. Persistent cough for 3 weeks or longer   2. Wheezing    I have personally viewed and independently interpreted the imaging studies ordered this visit. CXR: no infiltrates noted; hyperexpansion.    OTC symptom care as needed.  Meds ordered this encounter  Medications   dexamethasone (DECADRON) injection 10 mg   benzonatate  (TESSALON ) 100 MG capsule    Sig: Take 1 capsule by mouth every 8 (eight) hours for cough.    Dispense:  21 capsule    Refill:  0   azithromycin  (ZITHROMAX ) 250 MG tablet    Sig: Take 1 tablet (250 mg total) by mouth daily. Take first 2 tablets together, then 1 every day until finished.    Dispense:  6 tablet    Refill:  0   albuterol (VENTOLIN HFA) 108 (90 Base) MCG/ACT inhaler    Sig: Inhale 1-2 puffs into the lungs every 6 (six) hours as needed for wheezing or shortness of breath.    Dispense:  1 each    Refill:  1      Reviewed expectations re: course of current medical issues. Questions answered. Outlined signs and symptoms indicating need for more acute intervention. Understanding verbalized. After Visit Summary given.   SUBJECTIVE: History from: Patient. Miranda [name] is a 68 y.o. female. Pt states cough and congestion for the past 3 weeks.  States she has taken OTC cold medicine with no relief. States she is coughing up yellow phlegm. Denies CP/SOB. Unsure if wheezing. Denies: fever. Normal PO intake without n/v/d.  Social History   Tobacco Use  Smoking Status Every Day   Current packs/day: 1.00   Average packs/day: 1 pack/day for 34.0 years (34.0 ttl pk-yrs)   Types: Cigarettes  Smokeless Tobacco Never    OBJECTIVE:  Vitals:   06/24/24 1036  BP: 128/73  Pulse: 93  Resp: 18  Temp: (!) 97.4 F (36.3 C)  TempSrc: Oral  SpO2: 92%    General appearance: alert; no distress Eyes: PERRLA; EOMI; conjunctiva normal HENT: [place]; AT; without  nasal congestion Neck: supple  Lungs: speaks full sentences without difficulty; unlabored; mod bilat exp wheezing Extremities: no edema Skin: warm and dry Neurologic: normal gait Psychological: alert and cooperative; normal mood and affect  Labs:  Labs Reviewed - No data to display  Imaging: DG Chest 2 View Result Date: 06/24/2024 EXAM: 2 VIEW(S) XRAY OF THE CHEST 06/24/2024 10:53:44 AM COMPARISON: AP radiograph of the chest dated 07/21/2021. CLINICAL HISTORY: persistent cough x 3 weeks or more FINDINGS: LUNGS AND PLEURA: The lungs are hyperexpanded. There is slight blunting of the costophrenic angles, suggesting small effusions. No pneumothorax. HEART AND MEDIASTINUM: The heart is normal in size. BONES AND SOFT TISSUES: There are bilateral breast implants. No acute osseous abnormality. IMPRESSION: 1. Hyperexpanded lungs with slight blunting of the costophrenic angles, suggesting small effusions. Electronically signed by: Evalene Coho MD 06/24/2024 11:03 AM EDT RP Workstation: [id number]    No Known Allergies  Past Medical History:  Diagnosis Date   Hypertension    Social History   Socioeconomic History   Marital status: Divorced    Spouse name: Not on file   Number of children: Not on file   Years of education: Not on file   Highest education level: Not on file  Occupational History   Not on file  Tobacco Use   Smoking status: Every Day    Current packs/day:  1.00    Average packs/day: 1 pack/day for 34.0 years (34.0 ttl pk-yrs)    Types: Cigarettes   Smokeless tobacco: Never  Substance and Sexual Activity   Alcohol  use: Never   Drug use: Never   Sexual activity: Not Currently  Other Topics Concern   Not on file  Social History Narrative   Not on file   Social Drivers of Health   Financial Resource Strain: Not on file  Food Insecurity: Not on file  Transportation Needs: Not on file  Physical Activity: Not on file  Stress: Not on file  Social Connections: Not  on file  Intimate Partner Violence: Not on file   Family History  Problem Relation Age of Onset   Heart disease Father    Past Surgical History:  Procedure Laterality Date   BREAST ENHANCEMENT SURGERY     SPINE SURGERY       Rolinda Rogue, MD 06/24/24 1121

## 2024-06-30 ENCOUNTER — Other Ambulatory Visit: Payer: Self-pay

## 2024-06-30 ENCOUNTER — Other Ambulatory Visit (HOSPITAL_COMMUNITY): Payer: Self-pay

## 2024-06-30 DIAGNOSIS — M549 Dorsalgia, unspecified: Secondary | ICD-10-CM | POA: Diagnosis not present

## 2024-06-30 DIAGNOSIS — J439 Emphysema, unspecified: Secondary | ICD-10-CM | POA: Diagnosis not present

## 2024-06-30 DIAGNOSIS — R059 Cough, unspecified: Secondary | ICD-10-CM | POA: Diagnosis not present

## 2024-06-30 DIAGNOSIS — F17200 Nicotine dependence, unspecified, uncomplicated: Secondary | ICD-10-CM | POA: Diagnosis not present

## 2024-06-30 MED ORDER — METHOCARBAMOL 750 MG PO TABS
750.0000 mg | ORAL_TABLET | Freq: Three times a day (TID) | ORAL | 0 refills | Status: AC
  Filled 2024-06-30 (×2): qty 30, 10d supply, fill #0

## 2024-06-30 MED ORDER — SPIRIVA RESPIMAT 1.25 MCG/ACT IN AERS
2.0000 | INHALATION_SPRAY | Freq: Every day | RESPIRATORY_TRACT | 5 refills | Status: AC
  Filled 2024-06-30: qty 4, 30d supply, fill #0

## 2024-06-30 MED ORDER — GUAIFENESIN ER 1200 MG PO TB12
1200.0000 mg | ORAL_TABLET | Freq: Two times a day (BID) | ORAL | 0 refills | Status: DC | PRN
  Filled 2024-06-30: qty 14, 7d supply, fill #0

## 2024-06-30 MED ORDER — TRAMADOL HCL 50 MG PO TABS
50.0000 mg | ORAL_TABLET | Freq: Two times a day (BID) | ORAL | 0 refills | Status: AC | PRN
  Filled 2024-06-30 (×2): qty 10, 5d supply, fill #0

## 2024-06-30 MED ORDER — NICOTINE 21-14-7 MG/24HR TD KIT
PACK | TRANSDERMAL | 0 refills | Status: AC
  Filled 2024-06-30: qty 1, 56d supply, fill #0

## 2024-07-03 ENCOUNTER — Other Ambulatory Visit (HOSPITAL_COMMUNITY): Payer: Self-pay

## 2024-07-03 ENCOUNTER — Other Ambulatory Visit: Payer: Self-pay

## 2024-07-06 ENCOUNTER — Other Ambulatory Visit (HOSPITAL_COMMUNITY): Payer: Self-pay

## 2024-07-07 ENCOUNTER — Other Ambulatory Visit: Payer: Self-pay

## 2024-07-08 ENCOUNTER — Other Ambulatory Visit (HOSPITAL_COMMUNITY): Payer: Self-pay

## 2024-07-11 ENCOUNTER — Other Ambulatory Visit (HOSPITAL_COMMUNITY): Payer: Self-pay

## 2024-07-14 ENCOUNTER — Other Ambulatory Visit (HOSPITAL_COMMUNITY): Payer: Self-pay

## 2024-08-09 ENCOUNTER — Other Ambulatory Visit (HOSPITAL_COMMUNITY): Payer: Self-pay

## 2024-09-18 ENCOUNTER — Emergency Department (HOSPITAL_COMMUNITY)

## 2024-09-18 ENCOUNTER — Inpatient Hospital Stay (HOSPITAL_COMMUNITY)
Admission: EM | Admit: 2024-09-18 | Discharge: 2024-09-20 | DRG: 640 | Disposition: A | Discharge status: Home / Self Care | Attending: Internal Medicine | Admitting: Internal Medicine

## 2024-09-18 ENCOUNTER — Encounter (HOSPITAL_COMMUNITY): Payer: Self-pay | Admitting: Emergency Medicine

## 2024-09-18 DIAGNOSIS — F17210 Nicotine dependence, cigarettes, uncomplicated: Secondary | ICD-10-CM | POA: Diagnosis present

## 2024-09-18 DIAGNOSIS — I10 Essential (primary) hypertension: Secondary | ICD-10-CM | POA: Diagnosis present

## 2024-09-18 DIAGNOSIS — K219 Gastro-esophageal reflux disease without esophagitis: Secondary | ICD-10-CM | POA: Diagnosis present

## 2024-09-18 DIAGNOSIS — Z1152 Encounter for screening for COVID-19: Secondary | ICD-10-CM | POA: Diagnosis not present

## 2024-09-18 DIAGNOSIS — E861 Hypovolemia: Secondary | ICD-10-CM | POA: Diagnosis present

## 2024-09-18 DIAGNOSIS — J9601 Acute respiratory failure with hypoxia: Secondary | ICD-10-CM | POA: Diagnosis present

## 2024-09-18 DIAGNOSIS — J441 Chronic obstructive pulmonary disease with (acute) exacerbation: Principal | ICD-10-CM | POA: Diagnosis present

## 2024-09-18 DIAGNOSIS — E876 Hypokalemia: Secondary | ICD-10-CM | POA: Diagnosis present

## 2024-09-18 DIAGNOSIS — Z8616 Personal history of COVID-19: Secondary | ICD-10-CM

## 2024-09-18 DIAGNOSIS — Z79899 Other long term (current) drug therapy: Secondary | ICD-10-CM

## 2024-09-18 DIAGNOSIS — R3129 Other microscopic hematuria: Secondary | ICD-10-CM | POA: Diagnosis present

## 2024-09-18 DIAGNOSIS — Z8249 Family history of ischemic heart disease and other diseases of the circulatory system: Secondary | ICD-10-CM

## 2024-09-18 DIAGNOSIS — E871 Hypo-osmolality and hyponatremia: Secondary | ICD-10-CM | POA: Diagnosis present

## 2024-09-18 DIAGNOSIS — F32A Depression, unspecified: Secondary | ICD-10-CM | POA: Diagnosis present

## 2024-09-18 DIAGNOSIS — R197 Diarrhea, unspecified: Secondary | ICD-10-CM | POA: Diagnosis present

## 2024-09-18 DIAGNOSIS — F419 Anxiety disorder, unspecified: Secondary | ICD-10-CM | POA: Diagnosis present

## 2024-09-18 DIAGNOSIS — R600 Localized edema: Secondary | ICD-10-CM | POA: Diagnosis not present

## 2024-09-18 DIAGNOSIS — R0902 Hypoxemia: Secondary | ICD-10-CM

## 2024-09-18 LAB — CBC WITH DIFFERENTIAL/PLATELET
Abs Immature Granulocytes: 0.1 10*3/uL — ABNORMAL HIGH (ref 0.00–0.07)
Basophils Absolute: 0 10*3/uL (ref 0.0–0.1)
Basophils Relative: 0 %
Eosinophils Absolute: 0 10*3/uL (ref 0.0–0.5)
Eosinophils Relative: 0 %
HCT: 37.7 % (ref 36.0–46.0)
Hemoglobin: 14 g/dL (ref 12.0–15.0)
Immature Granulocytes: 1 %
Lymphocytes Relative: 3 %
Lymphs Abs: 0.5 10*3/uL — ABNORMAL LOW (ref 0.7–4.0)
MCH: 31.5 pg (ref 26.0–34.0)
MCHC: 37.1 g/dL — ABNORMAL HIGH (ref 30.0–36.0)
MCV: 84.9 fL (ref 80.0–100.0)
Monocytes Absolute: 1.2 10*3/uL — ABNORMAL HIGH (ref 0.1–1.0)
Monocytes Relative: 7 %
Neutro Abs: 16 10*3/uL — ABNORMAL HIGH (ref 1.7–7.7)
Neutrophils Relative %: 89 %
Platelets: 255 10*3/uL (ref 150–400)
RBC: 4.44 MIL/uL (ref 3.87–5.11)
RDW: 12.5 % (ref 11.5–15.5)
Smear Review: NORMAL
WBC: 17.9 10*3/uL — ABNORMAL HIGH (ref 4.0–10.5)
nRBC: 0 % (ref 0.0–0.2)

## 2024-09-18 LAB — BASIC METABOLIC PANEL WITH GFR
Anion gap: 9 (ref 5–15)
BUN: <5 mg/dL — ABNORMAL LOW (ref 8–23)
CO2: 31 mmol/L (ref 22–32)
Calcium: 8.7 mg/dL — ABNORMAL LOW (ref 8.9–10.3)
Chloride: 82 mmol/L — ABNORMAL LOW (ref 98–111)
Creatinine, Ser: 0.46 mg/dL (ref 0.44–1.00)
GFR, Estimated: >60 mL/min
Glucose, Bld: 158 mg/dL — ABNORMAL HIGH (ref 70–99)
Potassium: 3.3 mmol/L — ABNORMAL LOW (ref 3.5–5.1)
Sodium: 123 mmol/L — ABNORMAL LOW (ref 135–145)

## 2024-09-18 LAB — URINALYSIS, W/ REFLEX TO CULTURE (INFECTION SUSPECTED)
Bilirubin Urine: NEGATIVE
Glucose, UA: NEGATIVE mg/dL
Ketones, ur: 5 mg/dL — AB
Leukocytes,Ua: NEGATIVE
Nitrite: NEGATIVE
Protein, ur: NEGATIVE mg/dL
Specific Gravity, Urine: 1.003 — ABNORMAL LOW (ref 1.005–1.030)
pH: 7 (ref 5.0–8.0)

## 2024-09-18 LAB — PROTIME-INR
INR: 1 (ref 0.8–1.2)
Prothrombin Time: 13.7 s (ref 11.4–15.2)

## 2024-09-18 LAB — COMPREHENSIVE METABOLIC PANEL WITH GFR
ALT: 17 U/L (ref 0–44)
AST: 27 U/L (ref 15–41)
Albumin: 4 g/dL (ref 3.5–5.0)
Alkaline Phosphatase: 129 U/L — ABNORMAL HIGH (ref 38–126)
Anion gap: 11 (ref 5–15)
BUN: <5 mg/dL — ABNORMAL LOW (ref 8–23)
CO2: 31 mmol/L (ref 22–32)
Calcium: 9 mg/dL (ref 8.9–10.3)
Chloride: 72 mmol/L — ABNORMAL LOW (ref 98–111)
Creatinine, Ser: 0.38 mg/dL — ABNORMAL LOW (ref 0.44–1.00)
GFR, Estimated: >60 mL/min
Glucose, Bld: 105 mg/dL — ABNORMAL HIGH (ref 70–99)
Potassium: 2.7 mmol/L — CL (ref 3.5–5.1)
Sodium: 114 mmol/L — CL (ref 135–145)
Total Bilirubin: 1 mg/dL (ref 0.0–1.2)
Total Protein: 6.7 g/dL (ref 6.5–8.1)

## 2024-09-18 LAB — I-STAT CG4 LACTIC ACID, ED
Lactic Acid, Venous: 0.8 mmol/L (ref 0.5–1.9)
Lactic Acid, Venous: 0.9 mmol/L (ref 0.5–1.9)

## 2024-09-18 LAB — BLOOD GAS, VENOUS
Acid-Base Excess: 11.2 mmol/L — ABNORMAL HIGH (ref 0.0–2.0)
Bicarbonate: 35.9 mmol/L — ABNORMAL HIGH (ref 20.0–28.0)
O2 Saturation: 86.4 %
Patient temperature: 37
pCO2, Ven: 46 mmHg (ref 44–60)
pH, Ven: 7.5 — ABNORMAL HIGH (ref 7.25–7.43)
pO2, Ven: 49 mmHg — ABNORMAL HIGH (ref 32–45)

## 2024-09-18 LAB — EXPECTORATED SPUTUM ASSESSMENT W GRAM STAIN, RFLX TO RESP C

## 2024-09-18 LAB — SODIUM
Sodium: 119 mmol/L — CL (ref 135–145)
Sodium: 121 mmol/L — ABNORMAL LOW (ref 135–145)
Sodium: 123 mmol/L — ABNORMAL LOW (ref 135–145)

## 2024-09-18 LAB — RESP PANEL BY RT-PCR (RSV, FLU A&B, COVID)  RVPGX2
Influenza A by PCR: NEGATIVE
Influenza B by PCR: NEGATIVE
Resp Syncytial Virus by PCR: NEGATIVE
SARS Coronavirus 2 by RT PCR: NEGATIVE

## 2024-09-18 LAB — MAGNESIUM: Magnesium: 1.6 mg/dL — ABNORMAL LOW (ref 1.7–2.4)

## 2024-09-18 LAB — LIPASE, BLOOD: Lipase: 16 U/L (ref 11–51)

## 2024-09-18 LAB — TROPONIN T, HIGH SENSITIVITY
Troponin T High Sensitivity: 10 ng/L (ref 0–19)
Troponin T High Sensitivity: 13 ng/L (ref 0–19)

## 2024-09-18 LAB — PRO BRAIN NATRIURETIC PEPTIDE: Pro Brain Natriuretic Peptide: 646 pg/mL — ABNORMAL HIGH

## 2024-09-18 LAB — PROCALCITONIN: Procalcitonin: <0.1 ng/mL

## 2024-09-18 LAB — MRSA NEXT GEN BY PCR, NASAL: MRSA by PCR Next Gen: NOT DETECTED

## 2024-09-18 LAB — PHOSPHORUS: Phosphorus: 2.6 mg/dL (ref 2.5–4.6)

## 2024-09-18 MED ORDER — SODIUM CHLORIDE 0.9 % IV SOLN: Freq: Once | INTRAVENOUS | Status: AC

## 2024-09-18 MED ORDER — ONDANSETRON HCL 4 MG/2ML IJ SOLN: 4.0000 mg | Freq: Four times a day (QID) | INTRAMUSCULAR | Status: DC | PRN

## 2024-09-18 MED ORDER — ACETAMINOPHEN 325 MG PO TABS
650.0000 mg | ORAL_TABLET | Freq: Four times a day (QID) | ORAL | Status: DC | PRN
  Administered 2024-09-20: 650 mg via ORAL
  Filled 2024-09-18: qty 2

## 2024-09-18 MED ORDER — ALBUTEROL SULFATE (2.5 MG/3ML) 0.083% IN NEBU: 2.5000 mg | INHALATION_SOLUTION | RESPIRATORY_TRACT | Status: DC | PRN

## 2024-09-18 MED ORDER — ACETAMINOPHEN 650 MG RE SUPP: 650.0000 mg | Freq: Four times a day (QID) | RECTAL | Status: DC | PRN

## 2024-09-18 MED ORDER — IPRATROPIUM-ALBUTEROL 0.5-2.5 (3) MG/3ML IN SOLN
3.0000 mL | Freq: Three times a day (TID) | RESPIRATORY_TRACT | Status: DC
  Administered 2024-09-18 – 2024-09-20 (×5): 3 mL via RESPIRATORY_TRACT
  Filled 2024-09-18 (×5): qty 3

## 2024-09-18 MED ORDER — SODIUM CHLORIDE 0.9 % IV SOLN: INTRAVENOUS | Status: DC

## 2024-09-18 MED ORDER — LACTATED RINGERS IV SOLN: INTRAVENOUS | Status: DC

## 2024-09-18 MED ORDER — ONDANSETRON HCL 4 MG PO TABS: 4.0000 mg | ORAL_TABLET | Freq: Four times a day (QID) | ORAL | Status: DC | PRN

## 2024-09-18 MED ORDER — ENOXAPARIN SODIUM 30 MG/0.3ML IJ SOSY
30.0000 mg | PREFILLED_SYRINGE | INTRAMUSCULAR | Status: DC
  Administered 2024-09-18: 30 mg via SUBCUTANEOUS
  Filled 2024-09-18: qty 0.3

## 2024-09-18 MED ORDER — POTASSIUM CHLORIDE CRYS ER 20 MEQ PO TBCR
40.0000 meq | EXTENDED_RELEASE_TABLET | Freq: Once | ORAL | Status: AC
  Administered 2024-09-18: 40 meq via ORAL
  Filled 2024-09-18: qty 2

## 2024-09-18 MED ORDER — PREDNISONE 20 MG PO TABS
40.0000 mg | ORAL_TABLET | Freq: Every day | ORAL | Status: DC
  Administered 2024-09-19 – 2024-09-20 (×2): 40 mg via ORAL
  Filled 2024-09-18 (×2): qty 2

## 2024-09-18 MED ORDER — MAGNESIUM SULFATE 2 GM/50ML IV SOLN
2.0000 g | Freq: Once | INTRAVENOUS | Status: AC
  Administered 2024-09-18: 2 g via INTRAVENOUS
  Filled 2024-09-18: qty 50

## 2024-09-18 MED ORDER — ORAL CARE MOUTH RINSE: 15.0000 mL | OROMUCOSAL | Status: DC | PRN

## 2024-09-18 MED ORDER — LACTATED RINGERS IV BOLUS (SEPSIS)
1000.0000 mL | Freq: Once | INTRAVENOUS | Status: AC
  Administered 2024-09-18: 1000 mL via INTRAVENOUS

## 2024-09-18 MED ORDER — SODIUM CHLORIDE 0.9 % IV SOLN
1.0000 g | Freq: Once | INTRAVENOUS | Status: AC
  Administered 2024-09-18: 1 g via INTRAVENOUS
  Filled 2024-09-18: qty 10

## 2024-09-18 MED ORDER — POTASSIUM CHLORIDE 10 MEQ/100ML IV SOLN
10.0000 meq | INTRAVENOUS | Status: AC
  Administered 2024-09-18 (×2): 10 meq via INTRAVENOUS
  Filled 2024-09-18 (×2): qty 100

## 2024-09-18 MED ORDER — CHLORHEXIDINE GLUCONATE CLOTH 2 % EX PADS
6.0000 | MEDICATED_PAD | Freq: Every day | CUTANEOUS | Status: DC
  Administered 2024-09-18 – 2024-09-20 (×2): 6 via TOPICAL

## 2024-09-18 MED ORDER — IPRATROPIUM-ALBUTEROL 0.5-2.5 (3) MG/3ML IN SOLN
3.0000 mL | Freq: Once | RESPIRATORY_TRACT | Status: AC
  Administered 2024-09-18: 3 mL via RESPIRATORY_TRACT
  Filled 2024-09-18: qty 3

## 2024-09-18 MED ORDER — METHYLPREDNISOLONE SODIUM SUCC 125 MG IJ SOLR
125.0000 mg | Freq: Once | INTRAMUSCULAR | Status: AC
  Administered 2024-09-18: 125 mg via INTRAVENOUS
  Filled 2024-09-18: qty 2

## 2024-09-18 MED ORDER — DOXYCYCLINE HYCLATE 100 MG PO TABS
100.0000 mg | ORAL_TABLET | Freq: Two times a day (BID) | ORAL | Status: DC
  Administered 2024-09-19 – 2024-09-20 (×3): 100 mg via ORAL
  Filled 2024-09-18 (×3): qty 1

## 2024-09-18 MED ORDER — SODIUM CHLORIDE 0.9 % IV SOLN
1.0000 g | INTRAVENOUS | Status: DC
  Administered 2024-09-19: 1 g via INTRAVENOUS
  Filled 2024-09-18: qty 10

## 2024-09-18 MED ORDER — AMLODIPINE BESYLATE 10 MG PO TABS
10.0000 mg | ORAL_TABLET | Freq: Every day | ORAL | Status: DC
  Administered 2024-09-18 – 2024-09-20 (×3): 10 mg via ORAL
  Filled 2024-09-18: qty 1
  Filled 2024-09-18: qty 2
  Filled 2024-09-18: qty 1

## 2024-09-18 MED ORDER — AZITHROMYCIN 250 MG PO TABS
500.0000 mg | ORAL_TABLET | Freq: Once | ORAL | Status: AC
  Administered 2024-09-18: 500 mg via ORAL
  Filled 2024-09-18: qty 2

## 2024-09-18 MED ORDER — SODIUM CHLORIDE 0.9 % IV SOLN: 500.0000 mg | Freq: Once | INTRAVENOUS | Status: DC

## 2024-09-18 NOTE — ED Provider Notes (Signed)
 " [place] EMERGENCY DEPARTMENT AT [facility] Provider Note   CSN: [id number] Arrival date & time: 09/18/24  9087     Patient presents with: No chief complaint on file.   Miranda [name] Record is a 69 y.o. female.history of hypertension, GERD, COPD.  Last note from urgent care was 11\1\25 patient seen for persistent cough for 3 weeks and wheezing treated with Decadron , Zithromax  and albuterol .  {Add pertinent medical, surgical, social history, OB history to YEP:67052} HPI Reports she started getting sick Friday, 3 days ago.  She started feeling generally weak.  Patient reports she had loss of appetite and has not eaten or drank much of anything.  She reports she has had no vomiting.  Slight amount of diarrhea for couple of days.  Patient reports she has been coughing.  Productive of brown and yellow sputum.  No bloody sputum.  Patient denies she had chest pain but reports she is felt quite short of breath and has had to sleep sitting upright.  Patient does have history of COPD.  She reports she is not on home oxygen.  She does continue to smoke but has really had to cut back due to being sick.  He has noticed swelling in her feet and ankles.    Prior to Admission medications  Medication Sig Start Date End Date Taking? Authorizing Provider  acetaminophen  (TYLENOL ) 500 MG tablet Take 500-1,000 mg by mouth every 6 (six) hours as needed for mild pain, fever or headache.    [provider]  albuterol  (VENTOLIN  HFA) 108 (90 Base) MCG/ACT inhaler Inhale 1-2 puffs into the lungs every 6 (six) hours as needed for wheezing or shortness of breath. 06/24/24   Rolinda Rogue, MD  amLODipine  (NORVASC ) 10 MG tablet Take 1 tablet (10 mg total) by mouth daily. 06/18/23     amLODipine  (NORVASC ) 10 MG tablet Take 1 tablet (10 mg total) by mouth daily. 02/04/24     amLODipine  (NORVASC ) 2.5 MG tablet Take 1 tablet by mouth daily 08/01/21     amLODipine  (NORVASC ) 5 MG tablet Take 1 tablet (5 mg  total) by mouth daily. 07/24/21   Jillian Buttery, MD  amLODipine  (NORVASC ) 5 MG tablet Take 1 tablet by mouth daily 04/01/22     azithromycin  (ZITHROMAX ) 250 MG tablet Take first 2 tablets together, then 1 every day until finished. 06/24/24   Rolinda Rogue, MD  benzonatate  (TESSALON ) 100 MG capsule Take 1 capsule (100 mg total) by mouth every 8 (eight) hours as needed FOR COUGH 06/24/24   Rolinda Rogue, MD  Cholecalciferol  (VITAMIN D3) 1.25 MG (50000 UT) CAPS Take 1 capsule by mouth once weekly 02/04/24     Guaifenesin  1200 MG TB12 Take 1 tablet (1,200 mg total) by mouth 2 (two) times daily as needed. 06/30/24     ibuprofen (ADVIL) 200 MG tablet Take 400-600 mg by mouth every 6 (six) hours as needed for fever, headache or mild pain.    [provider]  methocarbamol  (ROBAXIN ) 750 MG tablet Take 1 tablet (750 mg total) by mouth in the morning, at noon, and at bedtime. 06/30/24     Nicotine  21-14-7 MG/24HR KIT Apply to skin daily as directed 06/30/24     Tiotropium Bromide  (SPIRIVA  RESPIMAT) 1.25 MCG/ACT AERS Take 2 puffs by mouth daily. 06/30/24     traMADol  (ULTRAM ) 50 MG tablet Take 1 tablet (50 mg total) by mouth 2 (two) times daily as needed. 06/30/24       Allergies: Patient has  no known allergies.    Review of Systems  Updated Vital Signs There were no vitals taken for this visit.  Physical Exam Constitutional:      Comments: Frail in appearance.  Generally weak.  No visible respiratory distress at rest.  HENT:     Mouth/Throat:     Mouth: Mucous membranes are dry.     Pharynx: Oropharynx is clear.  Cardiovascular:     Rate and Rhythm: Normal rate and regular rhythm.  Pulmonary:     Comments: No respiratory distress at rest.  Patient does have symmetric breath sounds.  Breath sounds are soft to one third of the bases.  Occasional fine expiratory wheeze. Abdominal:     General: There is no distension.     Palpations: Abdomen is soft.     Tenderness: There is no abdominal  tenderness. There is no guarding.  Musculoskeletal:     Comments: Lower extremities are symmetric.  Patient has about 1-2+ pitting edema of the feet and ankles.  No wounds on the feet or ankles.  Warm and dry.  Calves are soft and pliable.  Skin:    General: Skin is warm and dry.     Coloration: Skin is pale.  Neurological:     General: No focal deficit present.     Mental Status: She is oriented to person, place, and time.     Comments: No focal motor deficit.  Patient does appear ill and is slightly somnolent but is answering questions appropriately.     (all labs ordered are listed, but only abnormal results are displayed) Labs Reviewed - No data to display  EKG: None  Radiology: No results found.  {Document cardiac monitor, telemetry assessment procedure when appropriate:32947} Procedures   Medications Ordered in the ED - No data to display    {Click here for ABCD2, HEART and other calculators REFRESH Note before signing:1}                              Medical Decision Making  Patient presents as outlined.  She does have known history of COPD but not oxygen dependent.  She describes a loss of appetite, cough and general weakness.  Patient was hypoxic on EMS arrival at 77% on room air and in the emergency department 84% on room air.  Will proceed with diagnostic evaluation for pneumonia viral versus bacterial\COPD exacerbation\sepsis\ACS\CHF.  Will initiate volume resuscitation with lactated Ringer 's and treatment of respiratory symptom for COPD with DuoNeb.  {Document critical care time when appropriate  Document review of labs and clinical decision tools ie CHADS2VASC2, etc  Document your independent review of radiology images and any outside records  Document your discussion with family members, caretakers and with consultants  Document social determinants of health affecting pt's care  Document your decision making why or why not admission, treatments were  needed:32947:::1}   Final diagnoses:  None    ED Discharge Orders     None        "

## 2024-09-18 NOTE — ED Triage Notes (Signed)
 69 y/o female comes in by EMS c/o sob, fevers, diarrhea and a cough for the past few days. Per EMS, on arrival pts oxygen was 77 on room air, pt placed on 4L [place] with relief, pt does not use O2 at home.

## 2024-09-18 NOTE — H&P (Signed)
 " History and Physical    Patient: Miranda [name] FMW:[id number] DOB: 11/07/1955 DOA: 09/18/2024 DOS: the patient was seen and examined on 09/18/2024 PCP: Wonda Worth SQUIBB, PA  Patient coming from: Home  Chief Complaint: Shortness of breath.  HPI: Emila Steinhauser is a 69 y.o. female with medical history significant of hypertension, anxiety, depression, COVID-19, tobacco use in remission, headaches, osteoarthritis, osteoporosis, history of hemorrhoids who presented to the emergency department with complaints of dyspnea, occasional productive cough, subjective low-grade fevers and soft stools since Saturday, but had a large diarrheal bowel movement this morning.  When EMS arrived her O2 sat was 77% and was placed on nasal cannula oxygen at 4 LPM.  She has been drinking a lot of water to keep hydrated.  No abdominal pain, nausea, emesis, constipation, melena or hematochezia. He denied fever, chills, rhinorrhea, sore throat, wheezing or hemoptysis.  No chest pain, palpitations, diaphoresis, PND, orthopnea, but stated she has developed pitting edema of the lower extremities injuries and dates. No flank pain, dysuria, frequency or hematuria.  No polyuria, polydipsia, polyphagia or blurred vision.   Labwork: Urinalysis was colorless with a specific gravity of 1003, moderate hemoglobin, ketones of 5 mg/dL and rare bacteria.  CBC showed a white count 17.6 with 89% neutrophils, hemoglobin 14.0 g/dL and platelets 744.  PT was 13.7 and INR 1.0.  Lactic acid x 2, troponin x 2 and lipase were normal.  proBNP 646.0 pg/mL.  Venous blood gas showed a pH of 7.5, pCO2 of 46 and pO2 of 49 mmHg.  Bicarbonate was 35.9 and acid base excess 11.2 mmol/L.  CMP showed a sodium 114, potassium 2.7, chloride 72 and CO2 31 mmol/L with a normal anion gap.  Glucose 105, BUN less than 5 and creatinine 0.38 mg/dL.  Calcium level was normal.  LFTs showed a mildly elevated alk phos at 129 units/L, but was otherwise unremarkable.   Phosphorus 2.6 and magnesium  1.6 mg/dL.  Imaging: Portable 1 view chest radiograph with no acute cardiopulmonary abnormality.   ED course: Initial vital signs were temperature 98.5 F, pulse [place] 1, respiration 18, BP 153/90 mmHg and O2 sat 94% on room air.  The patient received normal saline infusion at 150 mL/h, azithromycin  500 mg p.o. x 1, ceftriaxone  1 g IVPB x 1, LR 1000 mL bolus, methylprednisolone  125 mg IVP, KCl 10 mEq IVPB x 4 and KCl 40 mEq p.o. x 1 dose.  Review of Systems: As mentioned in the history of present illness. All other systems reviewed and are negative. Past Medical History:  Diagnosis Date   Hypertension    Past Surgical History:  Procedure Laterality Date   BREAST ENHANCEMENT SURGERY     SPINE SURGERY     Social History:  reports that she has been smoking cigarettes. She has a 34 pack-year smoking history. She has never used smokeless tobacco. She reports that she does not drink alcohol  and does not use drugs.  Allergies[1]  Family History  Problem Relation Age of Onset   Heart disease Father     Prior to Admission medications  Medication Sig Start Date End Date Taking? Authorizing Provider  acetaminophen  (TYLENOL ) 500 MG tablet Take 500-1,000 mg by mouth every 6 (six) hours as needed for mild pain, fever or headache.   Yes [provider]  albuterol  (VENTOLIN  HFA) 108 (90 Base) MCG/ACT inhaler Inhale 1-2 puffs into the lungs every 6 (six) hours as needed for wheezing or shortness of breath. 06/24/24  Yes Rolinda Rogue, MD  amLODipine  (NORVASC ) 10 MG tablet Take 1 tablet (10 mg total) by mouth daily. 02/04/24  Yes   ibuprofen (ADVIL) 200 MG tablet Take 400-600 mg by mouth every 6 (six) hours as needed for fever, headache or mild pain.   Yes [provider]  azithromycin  (ZITHROMAX ) 250 MG tablet Take first 2 tablets together, then 1 every day until finished. Patient not taking: Reported on 09/18/2024 06/24/24   Rolinda Rogue, MD  benzonatate   (TESSALON ) 100 MG capsule Take 1 capsule (100 mg total) by mouth every 8 (eight) hours as needed FOR COUGH Patient not taking: Reported on 09/18/2024 06/24/24   Rolinda Rogue, MD  Cholecalciferol  (VITAMIN D3) 1.25 MG (50000 UT) CAPS Take 1 capsule by mouth once weekly Patient not taking: Reported on 09/18/2024 02/04/24     Guaifenesin  1200 MG TB12 Take 1 tablet (1,200 mg total) by mouth 2 (two) times daily as needed. Patient not taking: Reported on 09/18/2024 06/30/24     methocarbamol  (ROBAXIN ) 750 MG tablet Take 1 tablet (750 mg total) by mouth in the morning, at noon, and at bedtime. Patient not taking: Reported on 09/18/2024 06/30/24     Nicotine  21-14-7 MG/24HR KIT Apply to skin daily as directed Patient not taking: Reported on 09/18/2024 06/30/24     Tiotropium Bromide  (SPIRIVA  RESPIMAT) 1.25 MCG/ACT AERS Take 2 puffs by mouth daily. Patient not taking: Reported on 09/18/2024 06/30/24     traMADol  (ULTRAM ) 50 MG tablet Take 1 tablet (50 mg total) by mouth 2 (two) times daily as needed. Patient not taking: Reported on 09/18/2024 06/30/24       Physical Exam: Vitals:   09/18/24 0935 09/18/24 1307  BP: (!) 153/90 (!) 151/85  Pulse: (!) 101 (!) 102  Resp: 18 20  Temp: 98.5 F (36.9 C) 98.2 F (36.8 C)  SpO2: 94% 92%   Physical Exam Vitals and nursing note reviewed.  Constitutional:      General: She is awake. She is not in acute distress.    Appearance: She is ill-appearing.     Interventions: Nasal cannula in place.  HENT:     Head: Normocephalic.     Nose: No rhinorrhea.     Mouth/Throat:     Mouth: Mucous membranes are moist.  Eyes:     General: No scleral icterus.    Pupils: Pupils are equal, round, and reactive to light.  Neck:     Vascular: No JVD.  Cardiovascular:     Rate and Rhythm: Normal rate and regular rhythm.     Heart sounds: S1 normal and S2 normal.  Pulmonary:     Effort: Pulmonary effort is normal.     Breath sounds: Wheezing and rhonchi present. No rales.   Abdominal:     General: Bowel sounds are normal. There is no distension.     Palpations: Abdomen is soft.     Tenderness: There is no abdominal tenderness. There is no right CVA tenderness or left CVA tenderness.  Musculoskeletal:     Cervical back: Neck supple.     Right lower leg: 1+ Edema present.     Left lower leg: 1+ Edema present.  Skin:    General: Skin is warm and dry.     Coloration: Skin is not jaundiced or pale.  Neurological:     General: No focal deficit present.     Mental Status: She is alert and oriented to person, place, and time.  Psychiatric:        Mood and Affect:  Mood normal.        Behavior: Behavior normal. Behavior is cooperative.     Data Reviewed:  Results are pending, will review when available.  EKG: Vent. rate 103 BPM  PR interval 177 ms  QRS duration 93 ms  QT/QTcB 375/491 ms  P-R-T axes 88 94 73  Sinus tachycardia  Biatrial enlargement  Right axis deviation  RSR' in V1 or V2, probably normal variant  Consider left ventricular hypertrophy  Borderline prolonged  QT interval Artifact in lead(s) I II III aVR aVL aVF V1 V2 V5 V6   Assessment and Plan: Principal Problem:   Hyponatremia Inpatient/stepdown. Regular diet - Free water restriction. Continue normal saline infusion. Follow sodium level every 4 hours. Replace electrolytes as needed. Consult diabetes coordinator. Transition to SQ insulin per Endo tool.  Active Problems:   COPD exacerbation Continue supplemental oxygen. Methylprednisolone  125 mg IVP x1. Followed by prednisone  40 mg p.o. daily in a.m. Scheduled and as needed bronchodilators. Follow-up CBC and chemistry in the morning.     Hypokalemia Replacing. Magnesium  was supplemented. Follow-up potassium level in a.m.    Hypomagnesemia Magnesium  sulfate 2 g IVPB.    Essential hypertension  Continue amlodipine  10 mg p.o. daily.    GERD Antiacid, H2 blocker or PPI as needed.    Advance Care Planning:   Code  Status: Full Code   Consults:   Family Communication:   Severity of Illness: The appropriate patient status for this patient is INPATIENT. Inpatient status is judged to be reasonable and necessary in order to provide the required intensity of service to ensure the patient's safety. The patient's presenting symptoms, physical exam findings, and initial radiographic and laboratory data in the context of their chronic comorbidities is felt to place them at high risk for further clinical deterioration. Furthermore, it is not anticipated that the patient will be medically stable for discharge from the hospital within 2 midnights of admission.   * I certify that at the point of admission it is my clinical judgment that the patient will require inpatient hospital care spanning beyond 2 midnights from the point of admission due to high intensity of service, high risk for further deterioration and high frequency of surveillance required.*  Author: Alm Dorn Castor, MD 09/18/2024 2:16 PM  For on call review [email or web address].   This document was prepared using Dragon voice recognition software and may contain some unintended transcription errors.     [1] No Known Allergies  "

## 2024-09-18 NOTE — ED Notes (Signed)
 Writer walked into room to PT using the bathroom in the trash

## 2024-09-19 ENCOUNTER — Inpatient Hospital Stay (HOSPITAL_COMMUNITY)

## 2024-09-19 DIAGNOSIS — E871 Hypo-osmolality and hyponatremia: Secondary | ICD-10-CM | POA: Diagnosis not present

## 2024-09-19 DIAGNOSIS — R600 Localized edema: Secondary | ICD-10-CM | POA: Diagnosis not present

## 2024-09-19 LAB — CBC
HCT: 35.6 % — ABNORMAL LOW (ref 36.0–46.0)
Hemoglobin: 12.3 g/dL (ref 12.0–15.0)
MCH: 30.4 pg (ref 26.0–34.0)
MCHC: 34.6 g/dL (ref 30.0–36.0)
MCV: 88.1 fL (ref 80.0–100.0)
Platelets: 269 10*3/uL (ref 150–400)
RBC: 4.04 MIL/uL (ref 3.87–5.11)
RDW: 12.6 % (ref 11.5–15.5)
WBC: 13.7 10*3/uL — ABNORMAL HIGH (ref 4.0–10.5)
nRBC: 0 % (ref 0.0–0.2)

## 2024-09-19 LAB — BASIC METABOLIC PANEL WITH GFR
Anion gap: 8 (ref 5–15)
Anion gap: 9 (ref 5–15)
BUN: 5 mg/dL — ABNORMAL LOW (ref 8–23)
BUN: 9 mg/dL (ref 8–23)
CO2: 32 mmol/L (ref 22–32)
CO2: 30 mmol/L (ref 22–32)
Calcium: 8.9 mg/dL (ref 8.9–10.3)
Calcium: 9 mg/dL (ref 8.9–10.3)
Chloride: 87 mmol/L — ABNORMAL LOW (ref 98–111)
Chloride: 88 mmol/L — ABNORMAL LOW (ref 98–111)
Creatinine, Ser: 0.52 mg/dL (ref 0.44–1.00)
Creatinine, Ser: 1.01 mg/dL — ABNORMAL HIGH (ref 0.44–1.00)
GFR, Estimated: >60 mL/min
GFR, Estimated: >60 mL/min
Glucose, Bld: 97 mg/dL (ref 70–99)
Glucose, Bld: 173 mg/dL — ABNORMAL HIGH (ref 70–99)
Potassium: 3.5 mmol/L (ref 3.5–5.1)
Potassium: 3.5 mmol/L (ref 3.5–5.1)
Sodium: 127 mmol/L — ABNORMAL LOW (ref 135–145)
Sodium: 127 mmol/L — ABNORMAL LOW (ref 135–145)

## 2024-09-19 LAB — COMPREHENSIVE METABOLIC PANEL WITH GFR
ALT: 16 U/L (ref 0–44)
AST: 24 U/L (ref 15–41)
Albumin: 3.6 g/dL (ref 3.5–5.0)
Alkaline Phosphatase: 129 U/L — ABNORMAL HIGH (ref 38–126)
Anion gap: 10 (ref 5–15)
BUN: 5 mg/dL — ABNORMAL LOW (ref 8–23)
CO2: 30 mmol/L (ref 22–32)
Calcium: 8.6 mg/dL — ABNORMAL LOW (ref 8.9–10.3)
Chloride: 87 mmol/L — ABNORMAL LOW (ref 98–111)
Creatinine, Ser: 0.44 mg/dL (ref 0.44–1.00)
GFR, Estimated: >60 mL/min
Glucose, Bld: 111 mg/dL — ABNORMAL HIGH (ref 70–99)
Potassium: 3.5 mmol/L (ref 3.5–5.1)
Sodium: 126 mmol/L — ABNORMAL LOW (ref 135–145)
Total Bilirubin: 0.3 mg/dL (ref 0.0–1.2)
Total Protein: 6 g/dL — ABNORMAL LOW (ref 6.5–8.1)

## 2024-09-19 LAB — RESPIRATORY PANEL BY PCR

## 2024-09-19 LAB — CORTISOL: Cortisol, Plasma: 9.3 ug/dL

## 2024-09-19 LAB — PROCALCITONIN: Procalcitonin: <0.1 ng/mL

## 2024-09-19 LAB — PRO BRAIN NATRIURETIC PEPTIDE: Pro Brain Natriuretic Peptide: 735 pg/mL — ABNORMAL HIGH

## 2024-09-19 LAB — ECHOCARDIOGRAM COMPLETE: S' Lateral: 2.5 cm

## 2024-09-19 LAB — OSMOLALITY, URINE: Osmolality, Ur: 159 mosm/kg — ABNORMAL LOW (ref 300–900)

## 2024-09-19 LAB — SODIUM, URINE, RANDOM: Sodium, Ur: 42 mmol/L

## 2024-09-19 LAB — OSMOLALITY: Osmolality: 263 mosm/kg — ABNORMAL LOW (ref 275–295)

## 2024-09-19 MED ORDER — GUAIFENESIN ER 600 MG PO TB12: 1200.0000 mg | ORAL_TABLET | Freq: Two times a day (BID) | ORAL | Status: DC | PRN

## 2024-09-19 MED ORDER — BENZONATATE 100 MG PO CAPS
200.0000 mg | ORAL_CAPSULE | Freq: Three times a day (TID) | ORAL | Status: DC | PRN
  Administered 2024-09-19 – 2024-09-20 (×3): 200 mg via ORAL
  Filled 2024-09-19 (×3): qty 2

## 2024-09-19 MED ORDER — HYDROCODONE BIT-HOMATROP MBR 5-1.5 MG/5ML PO SOLN
5.0000 mL | Freq: Two times a day (BID) | ORAL | Status: DC | PRN
  Administered 2024-09-19 (×2): 5 mL via ORAL
  Filled 2024-09-19 (×2): qty 5

## 2024-09-19 NOTE — Evaluation (Signed)
 Physical Therapy Evaluation Patient Details Name: Miranda [name] MRN: [id number] DOB: 03-01-1956 Today's Date: 09/19/2024  History of Present Illness  69 yo female admitted with hypovolemic hyponatremia, polydipsia, COPD exac. Hx of anxiety, depression, COVID, OA, osteoporosis, HA  Clinical Impression  On eval pt required Min a +2 for safety for mobility. She was able to eventually stand and perform stand pivot, bed<>bsc during session. Ambulation deferred 2* risk for falls-pt reports receiving cough medicine prior to session-made her very drowsy and unsteady during session. Assisted pt back to bed at her request. O2: 93% on 7L rest, 90% on 6L with activity, 92% on 7L end of session. Will plan to follow and progress activity as able. Will recommend HHPT f/u.       If plan is discharge home, recommend the following: A little help with walking and/or transfers;A little help with bathing/dressing/bathroom;Assistance with cooking/housework;Assist for transportation;Help with stairs or ramp for entrance   Can travel by private vehicle        Equipment Recommendations None recommended by PT  Recommendations for Other Services       Functional Status Assessment Patient has had a recent decline in their functional status and demonstrates the ability to make significant improvements in function in a reasonable and predictable amount of time.     Precautions / Restrictions Precautions Precautions: Fall Precaution/Restrictions Comments: monitor O2 Restrictions Weight Bearing Restrictions Per Provider Order: No      Mobility  Bed Mobility Overal bed mobility: Needs Assistance Bed Mobility: Supine to Sit, Sit to Supine     Supine to sit: Min assist Sit to supine: Contact guard assist   General bed mobility comments: Assist for safety and lines. Swaying with near LOB while sitting EOB    Transfers Overall transfer level: Needs assistance   Transfers: Sit to/from Stand, Bed to  chair/wheelchair/BSC Sit to Stand: Min assist, +2 safety/equipment Stand pivot transfers: Min assist, +2 safety/equipment         General transfer comment: Assist to steady with increased swaying 2* meds. Cues for safety. Fall risk currently with assist provided to prevent fall.    Ambulation/Gait               General Gait Details: Deferred for safety reasons-pt unsteady and drowsy 2* meds-increased risk for falls at this time  Careers Information Officer     Tilt Bed    Modified Rankin (Stroke Patients Only)       Balance Overall balance assessment: Needs assistance         Standing balance support: During functional activity Standing balance-Leahy Scale: Poor                               Pertinent Vitals/Pain Pain Assessment Pain Assessment: Faces Faces Pain Scale: No hurt    Home Living Family/patient expects to be discharged to:: Private residence Living Arrangements: Alone   Type of Home: House         Home Layout: Two level Home Equipment: None      Prior Function Prior Level of Function : Independent/Modified Independent;Working/employed             Mobility Comments: independent. works as Engineer, Structural Upper Extremity Assessment: Overall WFL for tasks assessed    Lower Extremity Assessment Lower Extremity Assessment:  Generalized weakness    Cervical / Trunk Assessment Cervical / Trunk Assessment: Normal  Communication   Communication Communication: No apparent difficulties    Cognition Arousal: Alert, Suspect due to medications (drowsy) Behavior During Therapy: WFL for tasks assessed/performed                             Following commands: Intact       Cueing Cueing Techniques: Verbal cues     General Comments      Exercises     Assessment/Plan    PT Assessment Patient needs continued PT services  PT  Problem List Decreased activity tolerance;Decreased mobility;Decreased balance       PT Treatment Interventions Gait training;DME instruction;Functional mobility training;Therapeutic activities;Therapeutic exercise;Patient/family education;Balance training    PT Goals (Current goals can be found in the Care Plan section)  Acute Rehab PT Goals Patient Stated Goal: get better PT Goal Formulation: With patient Time For Goal Achievement: 10/03/24 Potential to Achieve Goals: Good    Frequency Min 3X/week     Co-evaluation               AM-PAC PT 6 Clicks Mobility  Outcome Measure Help needed turning from your back to your side while in a flat bed without using bedrails?: A Little Help needed moving from lying on your back to sitting on the side of a flat bed without using bedrails?: A Little Help needed moving to and from a bed to a chair (including a wheelchair)?: A Little Help needed standing up from a chair using your arms (e.g., wheelchair or bedside chair)?: A Little Help needed to walk in hospital room?: A Little Help needed climbing 3-5 steps with a railing? : A Little 6 Click Score: 18    End of Session   Activity Tolerance: Patient tolerated treatment well (limited by drowsiness) Patient left: in bed;with call bell/phone within reach;with bed alarm set   PT Visit Diagnosis: Difficulty in walking, not elsewhere classified (R26.2);Unsteadiness on feet (R26.81)    Time: 1203-1224 PT Time Calculation (min) (ACUTE ONLY): 21 min   Charges:   PT Evaluation $PT Eval Low Complexity: 1 Low   PT General Charges $$ ACUTE PT VISIT: 1 Visit            Dannial SQUIBB, PT Acute Rehabilitation  Office: [phone]

## 2024-09-19 NOTE — Progress Notes (Addendum)
 " PROGRESS NOTE    Miranda [name]  FMW:[id number] DOB: 04/27/1956 DOA: 09/18/2024 PCP: Wonda Worth SQUIBB, PA  No chief complaint on file.   Brief Narrative:   69 yo with HTN, anxiety, depression and multiple other medical issues who presented to the ED with SOB, productive cough, subjective fevers, and concern for diarrhea.  Oxygen sats 77% on EMS arrival.  She's been admitted for hypovolemic hyponatremia and Tenesia Escudero COPD exacerbation.    Assessment & Plan:   Principal Problem:   Hyponatremia Active Problems:   COPD with acute exacerbation (HCC)   GERD   Hypokalemia   Hypomagnesemia   Essential hypertension  Hypovolemic Hyponatremia Polydipsia Presented with sodium 114 in setting of poor PO intake in the setting of her respiratory illness - she also was drinking Ambre Kobayashi lot of water to stay hydrated (6 water bottles [name] day).  Suspect combination of free water intake and poor solute intake with possible hypovolemia given improvement with IVF. Urine studies may help clarify, but not collected yet Has improved with IVF - faster than ideal, hold further IVF for now - encourage PO intake Note relevant hx of hyponatremia - in past associated with thiazide (on 2 occasions separated by 2 years), nausea/vomiting/diarrhea  TSH, cortisol  Continue to trend BMP She's stable for transfer to floor  Acute Hypoxic Respiratory Failure  COPD Exacerbation  Hypoxic to 77% on RA with EMS  On exam in H&P noted to be wheezing.  Only bibasilar crackles on my exam today, frequent cough.  Initial CXR without acute abnormality She notes worsening cough today, repeat CXR 1/27 pending Negative covid, flu, RSV - follow RVP Sputum cx with rare gram positive cocci.  Neg MRSA PCR. Steroids, scheduled and prn nebs.  Abx with cap coverage. Strict I/O, daily weights Consider CT scan if continued hypoxia out of proportion to exam and CXR findings  Leukocytosis In setting of above Follow resp cultures Blood cultures  pending  Lower Extremity Edema Mild, follow BNP Could be related to her chronic amlodipine  Echo is pending (she refused this) UA negative for protein.  Albumin 3.6.  Diarrhea Seems like this is resolved Hold off on any testing at this time   Hypokalemia Hypomagnesemia Will trend, replace as needed  Hypertension Amlodipine   GERD Noted, no meds at this time  Microscopic Hematuria Needs outpatient follow up    DVT prophylaxis: SCD - she refuses lovenox  Code Status: ful Family Communication: none Disposition:   Status is: Inpatient Remains inpatient appropriate because: need for continued inpatient care - monitoring of hyponatremia, treatment of COPD exacerbation   Consultants:  none  Procedures:  none  Antimicrobials:  Anti-infectives (From admission, onward)    Start     Dose/Rate Route Frequency Ordered Stop   09/19/24 1400  cefTRIAXone  (ROCEPHIN ) 1 g in sodium chloride  0.9 % 100 mL IVPB        1 g 200 mL/hr over 30 Minutes Intravenous Every 24 hours 09/18/24 1646     09/19/24 1000  doxycycline  (VIBRA -TABS) tablet 100 mg        100 mg Oral Every 12 hours 09/18/24 1646 09/23/24 0959   09/18/24 1315  azithromycin  (ZITHROMAX ) tablet 500 mg        500 mg Oral  Once 09/18/24 1304 09/18/24 1347   09/18/24 1300  cefTRIAXone  (ROCEPHIN ) 1 g in sodium chloride  0.9 % 100 mL IVPB        1 g 200 mL/hr over 30 Minutes Intravenous  Once 09/18/24 1258  09/18/24 1421   09/18/24 1300  azithromycin  (ZITHROMAX ) 500 mg in sodium chloride  0.9 % 250 mL IVPB  Status:  Discontinued        500 mg 250 mL/hr over 60 Minutes Intravenous  Once 09/18/24 1258 09/18/24 1304       Subjective:  Lots of concerns are you the doctor - how old are you - do you have some sense? Expresses concern that all we're worried about is her sodium and potassium She hasn't had any food  She doesn't use oxygen at home - SOB related to cough   Objective: Vitals:   09/19/24 0400 09/19/24 0500  09/19/24 0600 09/19/24 0700  BP: (!) 119/53 (!) 112/51 114/63 123/67  Pulse: 74 76 71 83  Resp: 12 14 14 18   Temp:      TempSrc:      SpO2: 100% 98% 100% 96%    Intake/Output Summary (Last 24 hours) at 09/19/2024 0810 Last data filed at 09/19/2024 0747 Gross per 24 hour  Intake 3250.04 ml  Output --  Net 3250.04 ml   There were no vitals filed for this visit.  Examination:  General exam: Appears calm and comfortable  Respiratory system: bibasilar crackles - occasional coughing towards end of interview - satting low 90's on RA initially, after coughing spells was in the mid 80's. Cardiovascular system: RRR Gastrointestinal system: Abdomen is nondistended, soft and nontender.  Central nervous system: Alert and oriented. No focal neurological deficits. Extremities: trace LE edema (she notes improved since yesterday)   Data Reviewed: I have personally reviewed following labs and imaging studies  CBC: Recent Labs  Lab 09/18/24 0938 09/19/24 0325  WBC 17.9* 13.7*  NEUTROABS 16.0*  --   HGB 14.0 12.3  HCT 37.7 35.6*  MCV 84.9 88.1  PLT 255 269    Basic Metabolic Panel: Recent Labs  Lab 09/18/24 0938 09/18/24 1440 09/18/24 1835 09/18/24 1951 09/18/24 2241 09/19/24 0325  NA 114* 119* 121* 123* 123* 126*  K 2.7*  --   --  3.3*  --  3.5  CL 72*  --   --  82*  --  87*  CO2 31  --   --  31  --  30  GLUCOSE 105*  --   --  158*  --  111*  BUN <5*  --   --  <5*  --  5*  CREATININE 0.38*  --   --  0.46  --  0.44  CALCIUM 9.0  --   --  8.7*  --  8.6*  MG  --  1.6*  --   --   --   --   PHOS  --  2.6  --   --   --   --     GFR: CrCl cannot be calculated (Unknown ideal weight.).  Liver Function Tests: Recent Labs  Lab 09/18/24 0938 09/19/24 0325  AST 27 24  ALT 17 16  ALKPHOS 129* 129*  BILITOT 1.0 0.3  PROT 6.7 6.0*  ALBUMIN 4.0 3.6    CBG: No results for input(s): GLUCAP in the last 168 hours.   Recent Results (from the past 240 hours)  Blood Culture  (routine x 2)     Status: None (Preliminary result)   Collection Time: 09/18/24  9:59 AM   Specimen: BLOOD  Result Value Ref Range Status   Specimen Description   Final    BLOOD LEFT ANTECUBITAL Performed at [facility], [address]  [place], [place] [address]    Special Requests   Final    BOTTLES DRAWN AEROBIC AND ANAEROBIC Blood Culture adequate volume Performed at [facility], [address], [place], [place] [address]    Culture   Final    NO GROWTH < 24 HOURS Performed at [facility], [address] [place], [place] [address]    Report Status PENDING  Incomplete  Blood Culture (routine x 2)     Status: None (Preliminary result)   Collection Time: 09/18/24 10:05 AM   Specimen: BLOOD RIGHT FOREARM  Result Value Ref Range Status   Specimen Description   Final    BLOOD RIGHT FOREARM Performed at [facility], [address] [place], [place] [address]    Special Requests   Final    BOTTLES DRAWN AEROBIC AND ANAEROBIC Blood Culture adequate volume Performed at [facility], [address] [place], [place] [address]    Culture   Final    NO GROWTH < 24 HOURS Performed at [facility], [address] [place], [place] [address]    Report Status PENDING  Incomplete  MRSA Next Gen by PCR, Nasal     Status: None   Collection Time: 09/18/24  6:38 PM   Specimen: Nasal Mucosa; Nasal Swab  Result Value Ref Range Status   MRSA by PCR Next Gen NOT DETECTED NOT DETECTED Final    Comment: (NOTE) The GeneXpert MRSA Assay (FDA approved for NASAL specimens only), is one component of Neftali Thurow comprehensive MRSA colonization surveillance program. It is not intended to diagnose MRSA infection nor to guide or monitor treatment for MRSA infections. Test performance is not FDA approved in patients less than 59 years old. Performed at [facility], [address], [place], [place] [address]   Resp  panel by RT-PCR (RSV, Flu Hildred Mollica&B, Covid) Anterior Nasal Swab     Status: None   Collection Time: 09/18/24  8:25 PM   Specimen: Anterior Nasal Swab  Result Value Ref Range Status   SARS Coronavirus 2 by RT PCR NEGATIVE NEGATIVE Final    Comment: (NOTE) SARS-CoV-2 target nucleic acids are NOT DETECTED.  The SARS-CoV-2 RNA is generally detectable in upper respiratory specimens during the acute phase of infection. The lowest concentration of SARS-CoV-2 viral copies this assay can detect is 138 copies/mL. Leonette Tischer negative result does not preclude SARS-Cov-2 infection and should not be used as the sole basis for treatment or other patient management decisions. Kazimierz Springborn negative result may occur with  improper specimen collection/handling, submission of specimen other than nasopharyngeal swab, presence of viral mutation(s) within the areas targeted by this assay, and inadequate number of viral copies(<138 copies/mL). Quantavia Frith negative result must be combined with clinical observations, patient history, and epidemiological information. The expected result is Negative.  Fact Sheet for Patients:  bloggercourse.com  Fact Sheet for Healthcare Providers:  seriousbroker.it  This test is no t yet approved or cleared by the [place]  FDA and  has been authorized for detection and/or diagnosis of SARS-CoV-2 by FDA under an Emergency Use Authorization (EUA). This EUA will remain  in effect (meaning this test can be used) for the duration of the COVID-19 declaration under Section 564(b)(1) of the Act, 21 U.S.C.section 360bbb-3(b)(1), unless the authorization is terminated  or revoked sooner.       Influenza Darin Arndt by PCR NEGATIVE NEGATIVE Final   Influenza B by PCR NEGATIVE NEGATIVE Final    Comment: (NOTE)  The Xpert Xpress SARS-CoV-2/FLU/RSV plus assay is intended as an aid in the diagnosis of influenza from Nasopharyngeal swab specimens and should not be used as Trevin Gartrell  sole basis for treatment. Nasal washings and aspirates are unacceptable for Xpert Xpress SARS-CoV-2/FLU/RSV testing.  Fact Sheet for Patients: bloggercourse.com  Fact Sheet for Healthcare Providers: seriousbroker.it  This test is not yet approved or cleared by the [place]  FDA and has been authorized for detection and/or diagnosis of SARS-CoV-2 by FDA under an Emergency Use Authorization (EUA). This EUA will remain in effect (meaning this test can be used) for the duration of the COVID-19 declaration under Section 564(b)(1) of the Act, 21 U.S.C. section 360bbb-3(b)(1), unless the authorization is terminated or revoked.     Resp Syncytial Virus by PCR NEGATIVE NEGATIVE Final    Comment: (NOTE) Fact Sheet for Patients: bloggercourse.com  Fact Sheet for Healthcare Providers: seriousbroker.it  This test is not yet approved or cleared by the [place]  FDA and has been authorized for detection and/or diagnosis of SARS-CoV-2 by FDA under an Emergency Use Authorization (EUA). This EUA will remain in effect (meaning this test can be used) for the duration of the COVID-19 declaration under Section 564(b)(1) of the Act, 21 U.S.C. section 360bbb-3(b)(1), unless the authorization is terminated or revoked.  Performed at [facility], [address], [place], [place] [address]   Expectorated Sputum Assessment w Gram Stain, Rflx to Resp Cult     Status: None   Collection Time: 09/18/24  8:25 PM   Specimen: Expectorated Sputum  Result Value Ref Range Status   Specimen Description EXPECTORATED SPUTUM  Final   Special Requests NONE  Final   Sputum evaluation   Final    THIS SPECIMEN IS ACCEPTABLE FOR SPUTUM CULTURE Performed at [facility], [address], [place], [place] [address]    Report Status 09/18/2024 FINAL  Final  Culture,  Respiratory w Gram Stain     Status: None (Preliminary result)   Collection Time: 09/18/24  8:25 PM  Result Value Ref Range Status   Specimen Description   Final    EXPECTORATED SPUTUM Performed at [facility], [address], [place], [place] [address]    Special Requests   Final    NONE Reflexed from [id number] Performed at [facility], [address], [place], [place] [address]    Gram Stain   Final    ABUNDANT WBC PRESENT,BOTH PMN AND MONONUCLEAR RARE GRAM POSITIVE COCCI Performed at [facility], [address], [place], [place] [address]    Culture PENDING  Incomplete   Report Status PENDING  Incomplete         Radiology Studies: DG Chest Port 1 View Result Date: 09/18/2024 EXAM: 1 VIEW(S) XRAY OF THE CHEST 09/18/2024 10:15:00 AM COMPARISON: 06/24/2024 CLINICAL HISTORY: Questionable sepsis. Evaluate for abnormality. FINDINGS: LUNGS AND PLEURA: Hyperinflated lungs with chronic coarsened interstitial markings of emphysema. No focal consolidation. No pleural effusion. No pneumothorax. HEART AND MEDIASTINUM: No acute abnormality of the cardiac and mediastinal silhouettes. BONES AND SOFT TISSUES: No acute osseous abnormality. IMPRESSION: 1. No acute cardiopulmonary abnormality. Electronically signed by: Waddell Calk MD 09/18/2024 10:36 AM EST RP Workstation: [id number]        Scheduled Meds:  amLODipine   10 mg Oral Daily   Chlorhexidine  Gluconate Cloth  6 each Topical Daily   doxycycline   100 mg Oral Q12H   enoxaparin  (LOVENOX ) injection  30 mg Subcutaneous Q24H   ipratropium-albuterol   3 mL Nebulization TID   predniSONE   40 mg Oral Q breakfast   Continuous Infusions:  cefTRIAXone  (ROCEPHIN )  IV       LOS: 1 day    Time spent: over 30 min    Meliton Monte, MD [facility]   To contact the attending provider between 7A-7P or the covering provider during after hours 7P-7A, please log into the web site  [email or web address] and access using universal [place] password for that web site. If you do not have the password, please call the hospital operator.  09/19/2024, 8:10 AM    "

## 2024-09-19 NOTE — Progress Notes (Signed)
 Patient refused 2D echo.

## 2024-09-20 ENCOUNTER — Other Ambulatory Visit (HOSPITAL_COMMUNITY): Payer: Self-pay

## 2024-09-20 DIAGNOSIS — E871 Hypo-osmolality and hyponatremia: Secondary | ICD-10-CM | POA: Diagnosis not present

## 2024-09-20 LAB — CBC WITH DIFFERENTIAL/PLATELET
Abs Immature Granulocytes: 0.09 10*3/uL — ABNORMAL HIGH (ref 0.00–0.07)
Basophils Absolute: 0 10*3/uL (ref 0.0–0.1)
Basophils Relative: 0 %
Eosinophils Absolute: 0 10*3/uL (ref 0.0–0.5)
Eosinophils Relative: 0 %
HCT: 33.2 % — ABNORMAL LOW (ref 36.0–46.0)
Hemoglobin: 11.5 g/dL — ABNORMAL LOW (ref 12.0–15.0)
Immature Granulocytes: 1 %
Lymphocytes Relative: 9 %
Lymphs Abs: 1.4 10*3/uL (ref 0.7–4.0)
MCH: 30.7 pg (ref 26.0–34.0)
MCHC: 34.6 g/dL (ref 30.0–36.0)
MCV: 88.8 fL (ref 80.0–100.0)
Monocytes Absolute: 1 10*3/uL (ref 0.1–1.0)
Monocytes Relative: 6 %
Neutro Abs: 13.7 10*3/uL — ABNORMAL HIGH (ref 1.7–7.7)
Neutrophils Relative %: 84 %
Platelets: 267 10*3/uL (ref 150–400)
RBC: 3.74 MIL/uL — ABNORMAL LOW (ref 3.87–5.11)
RDW: 13.2 % (ref 11.5–15.5)
WBC: 16.2 10*3/uL — ABNORMAL HIGH (ref 4.0–10.5)
nRBC: 0 % (ref 0.0–0.2)

## 2024-09-20 LAB — COMPREHENSIVE METABOLIC PANEL WITH GFR
ALT: 16 U/L (ref 0–44)
AST: 19 U/L (ref 15–41)
Albumin: 3.5 g/dL (ref 3.5–5.0)
Alkaline Phosphatase: 134 U/L — ABNORMAL HIGH (ref 38–126)
Anion gap: 9 (ref 5–15)
BUN: 11 mg/dL (ref 8–23)
CO2: 32 mmol/L (ref 22–32)
Calcium: 9 mg/dL (ref 8.9–10.3)
Chloride: 91 mmol/L — ABNORMAL LOW (ref 98–111)
Creatinine, Ser: 0.62 mg/dL (ref 0.44–1.00)
GFR, Estimated: >60 mL/min
Glucose, Bld: 98 mg/dL (ref 70–99)
Potassium: 3.2 mmol/L — ABNORMAL LOW (ref 3.5–5.1)
Sodium: 132 mmol/L — ABNORMAL LOW (ref 135–145)
Total Bilirubin: 0.2 mg/dL (ref 0.0–1.2)
Total Protein: 5.8 g/dL — ABNORMAL LOW (ref 6.5–8.1)

## 2024-09-20 LAB — MISC LABCORP TEST (SEND OUT): Labcorp test code: 83935

## 2024-09-20 LAB — TSH: TSH: 1.34 u[IU]/mL (ref 0.350–4.500)

## 2024-09-20 LAB — PHOSPHORUS: Phosphorus: 3 mg/dL (ref 2.5–4.6)

## 2024-09-20 LAB — MAGNESIUM: Magnesium: 2 mg/dL (ref 1.7–2.4)

## 2024-09-20 MED ORDER — POTASSIUM CHLORIDE CRYS ER 20 MEQ PO TBCR
40.0000 meq | EXTENDED_RELEASE_TABLET | Freq: Once | ORAL | Status: AC
  Administered 2024-09-20: 40 meq via ORAL
  Filled 2024-09-20: qty 2

## 2024-09-20 MED ORDER — AMOXICILLIN-POT CLAVULANATE 875-125 MG PO TABS
1.0000 | ORAL_TABLET | Freq: Two times a day (BID) | ORAL | 0 refills | Status: AC
  Filled 2024-09-20: qty 8, 4d supply, fill #0

## 2024-09-20 MED ORDER — BENZONATATE 200 MG PO CAPS
200.0000 mg | ORAL_CAPSULE | Freq: Three times a day (TID) | ORAL | 0 refills | Status: AC | PRN
  Filled 2024-09-20: qty 30, 10d supply, fill #0

## 2024-09-20 MED ORDER — HYDROCODONE BIT-HOMATROP MBR 5-1.5 MG/5ML PO SOLN
5.0000 mL | Freq: Two times a day (BID) | ORAL | 0 refills | Status: AC | PRN
  Filled 2024-09-20: qty 120, 12d supply, fill #0

## 2024-09-20 MED ORDER — PREDNISONE 10 MG PO TABS
ORAL_TABLET | ORAL | 0 refills | Status: AC
  Filled 2024-09-20: qty 16, 7d supply, fill #0

## 2024-09-20 NOTE — Discharge Summary (Signed)
 "  Physician Discharge Summary  Arlen Legendre FMW:[id number] DOB: June 11, 1956 DOA: 09/18/2024  PCP: Wonda Worth SQUIBB, PA  Admit date: 09/18/2024 Discharge date: 09/20/2024  Admitted From: home Disposition:  home  Recommendations for Outpatient Follow-up:  Follow up with PCP in 1-2 weeks Please obtain BMP/CBC in one week  Home Health: none Equipment/Devices: none  Discharge Condition: stable CODE STATUS: Full code Diet Orders (From admission, onward)     Start     Ordered   09/18/24 1619  Diet regular Room service appropriate? Yes; Fluid consistency: Thin  Diet effective now       Question Answer Comment  Room service appropriate? Yes   Fluid consistency: Thin      09/18/24 1618           Brief Narrative:  69 yo with HTN, anxiety, depression and multiple other medical issues who presented to the ED with SOB, productive cough, subjective fevers, and concern for diarrhea.  Oxygen sats 77% on EMS arrival.  She's been admitted for hypovolemic hyponatremia and a COPD exacerbation.    Hospital Course / Discharge diagnoses: Principal problem Hypovolemic Hyponatremia, polydipsia - Presented with sodium 114 in setting of poor PO intake in the setting of her respiratory illness - she also was drinking a lot of water to stay hydrated (6 water bottles a day).  Suspect combination of free water intake and poor solute intake with possible hypovolemia given improvement with IVF.  Urine studies not collected on admission but after IV fluids and not particularly helpful.  She does have a history of hyponatremia in the past associated with thiazide use.  TSH, cortisol unremarkable.  Sodium improved, she is feeling much better, asking to go home and will be discharged in stable condition  Active problems Acute Hypoxic Respiratory Failure, COPD Exacerbation  - Hypoxic to 77% on RA with EMS, was placed on steroids and antibiotics.  She improved, wheezing has resolved, will be converted to oral  antibiotics and a steroid taper. Leukocytosis - In setting of above, steroid use Lower Extremity Edema - Mild, follow BNP.  Refused echo, study could not be completed but LV appears normal Diarrhea - Seems like this is resolved Hypokalemia, Hypomagnesemia - Will trend, replace as needed Hypertension - Amlodipine  Microscopic Hematuria - Needs outpatient follow up with PCP since she is a smoker  Sepsis ruled out   Discharge Instructions   Allergies as of 09/20/2024   No Known Allergies      Medication List     STOP taking these medications    azithromycin  250 MG tablet Commonly known as: ZITHROMAX    Guaifenesin  1200 MG Tb12       TAKE these medications    acetaminophen  500 MG tablet Commonly known as: TYLENOL  Take 500-1,000 mg by mouth every 6 (six) hours as needed for mild pain, fever or headache.   albuterol  108 (90 Base) MCG/ACT inhaler Commonly known as: VENTOLIN  HFA Inhale 1-2 puffs into the lungs every 6 (six) hours as needed for wheezing or shortness of breath.   amLODipine  10 MG tablet Commonly known as: NORVASC  Take 1 tablet (10 mg total) by mouth daily.   amoxicillin -clavulanate 875-125 MG tablet Commonly known as: AUGMENTIN  Take 1 tablet by mouth 2 (two) times daily for 4 days.   benzonatate  200 MG capsule Commonly known as: TESSALON  Take 1 capsule (200 mg total) by mouth 3 (three) times daily as needed for cough. What changed:  medication strength how much to take when to take  this reasons to take this   HYDROcodone  bit-homatropine 5-1.5 MG/5ML syrup Commonly known as: HYCODAN Take 5 mLs by mouth 2 (two) times daily as needed for cough (cough not improved by tessalon  perles).   ibuprofen 200 MG tablet Commonly known as: ADVIL Take 400-600 mg by mouth every 6 (six) hours as needed for fever, headache or mild pain.   methocarbamol  750 MG tablet Commonly known as: ROBAXIN  Take 1 tablet (750 mg total) by mouth in the morning, at noon, and at  bedtime.   Nicotine  21-14-7 MG/24HR Kit Apply to skin daily as directed   predniSONE  10 MG tablet Commonly known as: DELTASONE  Take 4 tablets (40 mg total) by mouth daily with breakfast for 1 day, THEN 3 tablets (30 mg total) daily with breakfast for 2 days, THEN 2 tablets (20 mg total) daily with breakfast for 2 days, THEN 1 tablet (10 mg total) daily with breakfast for 2 days. Start taking on: September 20, 2024   Spiriva  Respimat 1.25 MCG/ACT Aers Generic drug: Tiotropium Bromide  Take 2 puffs by mouth daily.   traMADol  50 MG tablet Commonly known as: ULTRAM  Take 1 tablet (50 mg total) by mouth 2 (two) times daily as needed.   Vitamin D3 1.25 MG (50000 UT) Caps Take 1 capsule by mouth once weekly        Consultations: none  Procedures/Studies:  ECHOCARDIOGRAM COMPLETE Result Date: 09/19/2024    ECHOCARDIOGRAM REPORT   Patient Name:   JAIME DOME Date of Exam: 09/19/2024 Medical Rec #:  [id number]           Height:       64.0 in Accession #:    [id number]          Weight:       111.1 lb Date of Birth:  21-Apr-1956          BSA:          1.524 m Patient Age:    68 years            BP:           101/49 mmHg Patient Gender: F                   HR:           80 bpm. Exam Location:  Inpatient Procedure: 2D Echo (Both Spectral and Color Flow Doppler were utilized during            procedure). Indications:    R60.0 Lower extremity edema  History:        Patient has no prior history of Echocardiogram examinations.                 COPD; Risk Factors:Hypertension and Current Smoker.  Sonographer:    Sydnee Wilson Referring Phys: [id number] DAVID MANUEL ORTIZ  Sonographer Comments: Image acquisition challenging due to uncooperative patient. IMPRESSIONS  1. Very limited study. Only one view obtained before the patient refused the rest of the exam.  2. The left ventricle has normal function.  3. The mitral valve is normal in structure. No evidence of mitral stenosis.  4. The aortic valve is  grossly normal. Aortic valve regurgitation is not visualized. FINDINGS  Left Ventricle: The left ventricle has normal function. The left ventricular internal cavity size was normal in size. There is no left ventricular hypertrophy. Pericardium: There is no evidence of pericardial effusion. Mitral Valve: The mitral valve is normal in structure. No evidence of mitral valve  stenosis. Aortic Valve: The aortic valve is grossly normal. Aortic valve regurgitation is not visualized. Aorta: The aortic root is normal in size and structure.  LEFT VENTRICLE PLAX 2D LVIDd:         4.00 cm LVIDs:         2.50 cm LV PW:         1.30 cm LV IVS:        0.80 cm LVOT diam:     2.00 cm LVOT Area:     3.14 cm  LEFT ATRIUM         Index LA diam:    2.50 cm 1.64 cm/m   AORTA Ao Root diam: 3.30 cm  SHUNTS Systemic Diam: 2.00 cm Jerel Croitoru MD Electronically signed by Jerel Balding MD Signature Date/Time: 09/19/2024/2:22:48 PM    Final    DG CHEST PORT 1 VIEW Result Date: 09/19/2024 CLINICAL DATA:  Reactive, shortness of breath and hypoxia. COPD exacerbation. EXAM: PORTABLE CHEST 1 VIEW COMPARISON:  09/18/2024 FINDINGS: Stable top-normal heart size. Stable evidence of advanced emphysematous lung disease with significant bilateral pulmonary hyperinflation. There may be some mild bronchial thickening in both lower lobes, left greater than right. There is no evidence of pulmonary edema, focal airspace consolidation, pneumothorax or pleural fluid. The visualized skeletal structures are unremarkable. IMPRESSION: Stable evidence of advanced emphysematous lung disease with significant bilateral pulmonary hyperinflation. There may be some mild bronchial thickening in both lower lobes, left greater than right. Electronically Signed   By: Marcey Moan M.D.   On: 09/19/2024 09:12   DG Chest Port 1 View Result Date: 09/18/2024 EXAM: 1 VIEW(S) XRAY OF THE CHEST 09/18/2024 10:15:00 AM COMPARISON: 06/24/2024 CLINICAL HISTORY: Questionable  sepsis. Evaluate for abnormality. FINDINGS: LUNGS AND PLEURA: Hyperinflated lungs with chronic coarsened interstitial markings of emphysema. No focal consolidation. No pleural effusion. No pneumothorax. HEART AND MEDIASTINUM: No acute abnormality of the cardiac and mediastinal silhouettes. BONES AND SOFT TISSUES: No acute osseous abnormality. IMPRESSION: 1. No acute cardiopulmonary abnormality. Electronically signed by: Waddell Calk MD 09/18/2024 10:36 AM EST RP Workstation: [id number]     Subjective: - no chest pain, shortness of breath, no abdominal pain, nausea or vomiting.   Discharge Exam: BP 132/79 (BP Location: Right Arm)   Pulse 77   Temp 97.9 F (36.6 C)   Resp 20   Wt 47.1 kg   SpO2 94%   BMI 17.82 kg/m   General: Pt is alert, awake, not in acute distress Cardiovascular: RRR, S1/S2 +, no rubs, no gallops Respiratory: CTA bilaterally, no wheezing, no rhonchi Abdominal: Soft, NT, ND, bowel sounds + Extremities: no edema, no cyanosis    The results of significant diagnostics from this hospitalization (including imaging, microbiology, ancillary and laboratory) are listed below for reference.     Microbiology: Recent Results (from the past 240 hours)  Respiratory (~20 pathogens) panel by PCR     Status: None   Collection Time: 09/18/24  9:56 AM   Specimen: Nasopharyngeal Swab; Respiratory  Result Value Ref Range Status   Adenovirus NOT DETECTED NOT DETECTED Final   Coronavirus 229E NOT DETECTED NOT DETECTED Final    Comment: (NOTE) The Coronavirus on the Respiratory Panel, DOES NOT test for the novel  Coronavirus (2019 nCoV)    Coronavirus HKU1 NOT DETECTED NOT DETECTED Final   Coronavirus NL63 NOT DETECTED NOT DETECTED Final   Coronavirus OC43 NOT DETECTED NOT DETECTED Final   Metapneumovirus NOT DETECTED NOT DETECTED Final   Rhinovirus / Enterovirus NOT DETECTED  NOT DETECTED Final   Influenza A NOT DETECTED NOT DETECTED Final   Influenza B NOT DETECTED NOT  DETECTED Final   Parainfluenza Virus 1 NOT DETECTED NOT DETECTED Final   Parainfluenza Virus 2 NOT DETECTED NOT DETECTED Final   Parainfluenza Virus 3 NOT DETECTED NOT DETECTED Final   Parainfluenza Virus 4 NOT DETECTED NOT DETECTED Final   Respiratory Syncytial Virus NOT DETECTED NOT DETECTED Final   Bordetella pertussis NOT DETECTED NOT DETECTED Final   Bordetella Parapertussis NOT DETECTED NOT DETECTED Final   Chlamydophila pneumoniae NOT DETECTED NOT DETECTED Final   Mycoplasma pneumoniae NOT DETECTED NOT DETECTED Final    Comment: Performed at [facility], [address] [place], [place] [address]  Blood Culture (routine x 2)     Status: None (Preliminary result)   Collection Time: 09/18/24  9:59 AM   Specimen: BLOOD  Result Value Ref Range Status   Specimen Description   Final    BLOOD LEFT ANTECUBITAL Performed at [facility], [address], [place], [place] [address]    Special Requests   Final    BOTTLES DRAWN AEROBIC AND ANAEROBIC Blood Culture adequate volume Performed at [facility], [address], [place], [place] [address]    Culture   Final    NO GROWTH < 24 HOURS Performed at [facility], [address], [place], [place] [address]    Report Status PENDING  Incomplete  Blood Culture (routine x 2)     Status: None (Preliminary result)   Collection Time: 09/18/24 10:05 AM   Specimen: BLOOD RIGHT FOREARM  Result Value Ref Range Status   Specimen Description   Final    BLOOD RIGHT FOREARM Performed at [facility], [address], [place], [place] [address]    Special Requests   Final    BOTTLES DRAWN AEROBIC AND ANAEROBIC Blood Culture adequate volume Performed at [facility], [address] [place], [place] [address]    Culture   Final    NO GROWTH < 24 HOURS Performed at [facility], [address], [place], [place] [address]    Report Status PENDING  Incomplete  MRSA  Next Gen by PCR, Nasal     Status: None   Collection Time: 09/18/24  6:38 PM   Specimen: Nasal Mucosa; Nasal Swab  Result Value Ref Range Status   MRSA by PCR Next Gen NOT DETECTED NOT DETECTED Final    Comment: (NOTE) The GeneXpert MRSA Assay (FDA approved for NASAL specimens only), is one component of a comprehensive MRSA colonization surveillance program. It is not intended to diagnose MRSA infection nor to guide or monitor treatment for MRSA infections. Test performance is not FDA approved in patients less than 66 years old. Performed at [facility], [address] [place], [place] [address]   Resp panel by RT-PCR (RSV, Flu A&B, Covid) Anterior Nasal Swab     Status: None   Collection Time: 09/18/24  8:25 PM   Specimen: Anterior Nasal Swab  Result Value Ref Range Status   SARS Coronavirus 2 by RT PCR NEGATIVE NEGATIVE Final    Comment: (NOTE) SARS-CoV-2 target nucleic acids are NOT DETECTED.  The SARS-CoV-2 RNA is generally detectable in upper respiratory specimens during the acute phase of infection. The lowest concentration of SARS-CoV-2 viral copies this assay can detect is 138 copies/mL. A negative result does not preclude SARS-Cov-2 infection and should not be  used as the sole basis for treatment or other patient management decisions. A negative result may occur with  improper specimen collection/handling, submission of specimen other than nasopharyngeal swab, presence of viral mutation(s) within the areas targeted by this assay, and inadequate number of viral copies(<138 copies/mL). A negative result must be combined with clinical observations, patient history, and epidemiological information. The expected result is Negative.  Fact Sheet for Patients:  bloggercourse.com  Fact Sheet for Healthcare Providers:  seriousbroker.it  This test is no t yet approved or cleared by the [place]  FDA and   has been authorized for detection and/or diagnosis of SARS-CoV-2 by FDA under an Emergency Use Authorization (EUA). This EUA will remain  in effect (meaning this test can be used) for the duration of the COVID-19 declaration under Section 564(b)(1) of the Act, 21 U.S.C.section 360bbb-3(b)(1), unless the authorization is terminated  or revoked sooner.       Influenza A by PCR NEGATIVE NEGATIVE Final   Influenza B by PCR NEGATIVE NEGATIVE Final    Comment: (NOTE) The Xpert Xpress SARS-CoV-2/FLU/RSV plus assay is intended as an aid in the diagnosis of influenza from Nasopharyngeal swab specimens and should not be used as a sole basis for treatment. Nasal washings and aspirates are unacceptable for Xpert Xpress SARS-CoV-2/FLU/RSV testing.  Fact Sheet for Patients: bloggercourse.com  Fact Sheet for Healthcare Providers: seriousbroker.it  This test is not yet approved or cleared by the [place]  FDA and has been authorized for detection and/or diagnosis of SARS-CoV-2 by FDA under an Emergency Use Authorization (EUA). This EUA will remain in effect (meaning this test can be used) for the duration of the COVID-19 declaration under Section 564(b)(1) of the Act, 21 U.S.C. section 360bbb-3(b)(1), unless the authorization is terminated or revoked.     Resp Syncytial Virus by PCR NEGATIVE NEGATIVE Final    Comment: (NOTE) Fact Sheet for Patients: bloggercourse.com  Fact Sheet for Healthcare Providers: seriousbroker.it  This test is not yet approved or cleared by the [place]  FDA and has been authorized for detection and/or diagnosis of SARS-CoV-2 by FDA under an Emergency Use Authorization (EUA). This EUA will remain in effect (meaning this test can be used) for the duration of the COVID-19 declaration under Section 564(b)(1) of the Act, 21 U.S.C. section 360bbb-3(b)(1),  unless the authorization is terminated or revoked.  Performed at [facility], [address] [place], [place] [address]   Expectorated Sputum Assessment w Gram Stain, Rflx to Resp Cult     Status: None   Collection Time: 09/18/24  8:25 PM   Specimen: Expectorated Sputum  Result Value Ref Range Status   Specimen Description EXPECTORATED SPUTUM  Final   Special Requests NONE  Final   Sputum evaluation   Final    THIS SPECIMEN IS ACCEPTABLE FOR SPUTUM CULTURE Performed at [facility], [address] [place], [place] [address]    Report Status 09/18/2024 FINAL  Final  Culture, Respiratory w Gram Stain     Status: None (Preliminary result)   Collection Time: 09/18/24  8:25 PM  Result Value Ref Range Status   Specimen Description   Final    EXPECTORATED SPUTUM Performed at [facility], [address], [place], [place] [address]    Special Requests   Final    NONE Reflexed from [id number] Performed at [facility] [place] [facility], [address] [place], [place] [address]    Gram Stain   Final    ABUNDANT  WBC PRESENT,BOTH PMN AND MONONUCLEAR RARE GRAM POSITIVE COCCI Performed at [facility], [address] [place], [place] [address]    Culture PENDING  Incomplete   Report Status PENDING  Incomplete     Labs: Basic Metabolic Panel: Recent Labs  Lab 09/18/24 1440 09/18/24 1835 09/18/24 1951 09/18/24 2241 09/19/24 0325 09/19/24 0755 09/19/24 1621 09/20/24 0440  NA 119*   < > 123* 123* 126* 127* 127* 132*  K  --   --  3.3*  --  3.5 3.5 3.5 3.2*  CL  --   --  82*  --  87* 87* 88* 91*  CO2  --   --  31  --  30 32 30 32  GLUCOSE  --   --  158*  --  111* 97 173* 98  BUN  --   --  <5*  --  5* 5* 9 11  CREATININE  --   --  0.46  --  0.44 0.52 1.01* 0.62  CALCIUM  --   --  8.7*  --  8.6* 8.9 9.0 9.0  MG 1.6*  --   --   --   --   --   --  2.0  PHOS 2.6  --   --   --   --   --   --  3.0   < > = values in this  interval not displayed.   Liver Function Tests: Recent Labs  Lab 09/18/24 0938 09/19/24 0325 09/20/24 0440  AST 27 24 19   ALT 17 16 16   ALKPHOS 129* 129* 134*  BILITOT 1.0 0.3 0.2  PROT 6.7 6.0* 5.8*  ALBUMIN 4.0 3.6 3.5   CBC: Recent Labs  Lab 09/18/24 0938 09/19/24 0325 09/20/24 0440  WBC 17.9* 13.7* 16.2*  NEUTROABS 16.0*  --  13.7*  HGB 14.0 12.3 11.5*  HCT 37.7 35.6* 33.2*  MCV 84.9 88.1 88.8  PLT 255 269 267   CBG: No results for input(s): GLUCAP in the last 168 hours. Hgb A1c No results for input(s): HGBA1C in the last 72 hours. Lipid Profile No results for input(s): CHOL, HDL, LDLCALC, TRIG, CHOLHDL, LDLDIRECT in the last 72 hours. Thyroid  function studies Recent Labs    09/20/24 0440  TSH 1.340   Urinalysis    Component Value Date/Time   COLORURINE COLORLESS (A) 09/18/2024 1327   APPEARANCEUR CLEAR 09/18/2024 1327   LABSPEC 1.003 (L) 09/18/2024 1327   PHURINE 7.0 09/18/2024 1327   GLUCOSEU NEGATIVE 09/18/2024 1327   GLUCOSEU NEGATIVE 10/13/2006 1005   HGBUR MODERATE (A) 09/18/2024 1327   BILIRUBINUR NEGATIVE 09/18/2024 1327   KETONESUR 5 (A) 09/18/2024 1327   PROTEINUR NEGATIVE 09/18/2024 1327   UROBILINOGEN 0.2 05/22/2009 1200   NITRITE NEGATIVE 09/18/2024 1327   LEUKOCYTESUR NEGATIVE 09/18/2024 1327    FURTHER DISCHARGE INSTRUCTIONS:   Get Medicines reviewed and adjusted: Please take all your medications with you for your next visit with your Primary MD   Laboratory/radiological data: Please request your Primary MD to go over all hospital tests and procedure/radiological results at the follow up, please ask your Primary MD to get all Hospital records sent to his/her office.   In some cases, they will be blood work, cultures and biopsy results pending at the time of your discharge. Please request that your primary care M.D. goes through all the records of your hospital data and follows up on these results.   Also Note the  following: If you experience worsening of  your admission symptoms, develop shortness of breath, life threatening emergency, suicidal or homicidal thoughts you must seek medical attention immediately by calling 911 or calling your MD immediately  if symptoms less severe.   You must read complete instructions/literature along with all the possible adverse reactions/side effects for all the Medicines you take and that have been prescribed to you. Take any new Medicines after you have completely understood and accpet all the possible adverse reactions/side effects.    Do not drive when taking Pain medications or sleeping medications (Benzodaizepines)   Do not take more than prescribed Pain, Sleep and Anxiety Medications. It is not advisable to combine anxiety,sleep and pain medications without talking with your primary care practitioner   Special Instructions: If you have smoked or chewed Tobacco  in the last 2 yrs please stop smoking, stop any regular Alcohol   and or any Recreational drug use.   Wear Seat belts while driving.   Please note: You were cared for by a hospitalist during your hospital stay. Once you are discharged, your primary care physician will handle any further medical issues. Please note that NO REFILLS for any discharge medications will be authorized once you are discharged, as it is imperative that you return to your primary care physician (or establish a relationship with a primary care physician if you do not have one) for your post hospital discharge needs so that they can reassess your need for medications and monitor your lab values.  Time coordinating discharge: 35 minutes  SIGNED:  Nilda Fendt, MD, PhD 09/20/2024, 7:37 AM   "

## 2024-09-20 NOTE — Progress Notes (Signed)
 Discharge instructions reviewed with patient, verbalized understanding. Teachback completed. All questions answered. All belongings accounted for. Patient to follow up with MD in  1-2 weeks.

## 2024-09-20 NOTE — Discharge Instructions (Signed)
 Follow with Turmel, Caleb P, PA in 5-7 days  Please get a complete blood count and chemistry panel checked by your Primary MD at your next visit, and again as instructed by your Primary MD. Please get your medications reviewed and adjusted by your Primary MD.  Please request your Primary MD to go over all Hospital Tests and Procedure/Radiological results at the follow up, please get all Hospital records sent to your Prim MD by signing hospital release before you go home.  In some cases, there will be blood work, cultures and biopsy results pending at the time of your discharge. Please request that your primary care M.D. goes through all the records of your hospital data and follows up on these results.  If you had Pneumonia of Lung problems at the Hospital: Please get a 2 view Chest X ray done in 6-8 weeks after hospital discharge or sooner if instructed by your Primary MD.  If you have Congestive Heart Failure: Please call your Cardiologist or Primary MD anytime you have any of the following symptoms:  1) 3 pound weight gain in 24 hours or 5 pounds in 1 week  2) shortness of breath, with or without a dry hacking cough  3) swelling in the hands, feet or stomach  4) if you have to sleep on extra pillows at night in order to breathe  Follow cardiac low salt diet and 1.5 lit/day fluid restriction.  If you have diabetes Accuchecks 4 times/day, Once in AM empty stomach and then before each meal. Log in all results and show them to your primary doctor at your next visit. If any glucose reading is under 80 or above 300 call your primary MD immediately.  If you have Seizure/Convulsions/Epilepsy: Please do not drive, operate heavy machinery, participate in activities at heights or participate in high speed sports until you have seen by Primary MD or a Neurologist and advised to do so again. Per [place] statutes, patients with seizures are not allowed to drive until they have been  seizure-free for six months.  Use caution when using heavy equipment or power tools. Avoid working on ladders or at heights. Take showers instead of baths. Ensure the water temperature is not too high on the home water heater. Do not go swimming alone. Do not lock yourself in a room alone (i.e. bathroom). When caring for infants or small children, sit down when holding, feeding, or changing them to minimize risk of injury to the child in the event you have a seizure. Maintain good sleep hygiene. Avoid alcohol.   If you had Gastrointestinal Bleeding: Please ask your Primary MD to check a complete blood count within one week of discharge or at your next visit. Your endoscopic/colonoscopic biopsies that are pending at the time of discharge, will also need to followed by your Primary MD.  Get Medicines reviewed and adjusted. Please take all your medications with you for your next visit with your Primary MD  Please request your Primary MD to go over all hospital tests and procedure/radiological results at the follow up, please ask your Primary MD to get all Hospital records sent to his/her office.  If you experience worsening of your admission symptoms, develop shortness of breath, life threatening emergency, suicidal or homicidal thoughts you must seek medical attention immediately by calling 911 or calling your MD immediately  if symptoms less severe.  You must read complete instructions/literature along with all the possible adverse reactions/side effects for all the Medicines you  take and that have been prescribed to you. Take any new Medicines after you have completely understood and accpet all the possible adverse reactions/side effects.   Do not drive or operate heavy machinery when taking Pain medications.   Do not take more than prescribed Pain, Sleep and Anxiety Medications  Special Instructions: If you have smoked or chewed Tobacco  in the last 2 yrs please stop smoking, stop any regular  Alcohol  and or any Recreational drug use.  Wear Seat belts while driving.  Please note You were cared for by a hospitalist during your hospital stay. If you have any questions about your discharge medications or the care you received while you were in the hospital after you are discharged, you can call the unit and asked to speak with the hospitalist on call if the hospitalist that took care of you is not available. Once you are discharged, your primary care physician will handle any further medical issues. Please note that NO REFILLS for any discharge medications will be authorized once you are discharged, as it is imperative that you return to your primary care physician (or establish a relationship with a primary care physician if you do not have one) for your aftercare needs so that they can reassess your need for medications and monitor your lab values.  You can reach the hospitalist office at phone [phone] or fax [phone]   If you do not have a primary care physician, you can call [phone] for a physician referral.  Activity: As tolerated with Full fall precautions use walker/cane & assistance as needed    Diet: regular  Disposition Home

## 2024-09-20 NOTE — TOC Initial Note (Signed)
 Transition of Care [facility]) - Initial/Assessment Note    Patient Details  Name: Miranda [name] MRN: [id number] Date of Birth: [birth year]  [facility] CM/SW Contact:    Doneta Glenys DASEN, RN Phone Number: 09/20/2024, 9:30 AM  Clinical Narrative:                 PTA lives in a house alone since S.O died last week. Current [employer] employee. Denies DME;HH; and SDOH needs. Patients her son will transport home. Patient refused HH PT. IP CM signing off.  Expected Discharge Plan: Home/Self Care Barriers to Discharge: No Barriers Identified   Patient Goals and CMS Choice Patient states their goals for this hospitalization and ongoing recovery are:: Home alone CMS Medicare.gov Compare Post Acute Care list provided to::  (NA)        Expected Discharge Plan and Services In-house Referral: NA Discharge Planning Services: CM Consult Post Acute Care Choice: NA Living arrangements for the past 2 months: Single Family Home Expected Discharge Date: 09/20/24               DME Arranged: N/A DME Agency: NA       HH Arranged: Patient Refused HH HH Agency: NA        Prior Living Arrangements/Services Living arrangements for the past 2 months: Single Family Home Lives with:: Self Patient language and need for interpreter reviewed:: Yes Do you feel safe going back to the place where you live?: Yes      Need for Family Participation in Patient Care: No (Comment) Care giver support system in place?: Yes (comment) (son) Current home services:  (NA) Criminal Activity/Legal Involvement Pertinent to Current Situation/Hospitalization: No - Comment as needed  Activities of Daily Living      Permission Sought/Granted Permission sought to share information with : Case Manager Permission granted to share information with : No (Emergency contact died last week.)              Emotional Assessment Appearance:: Appears stated age Attitude/Demeanor/Rapport: Angry Affect (typically  observed): Agitated, Angry Orientation: : Oriented to Self, Oriented to Place, Oriented to  Time, Oriented to Situation Alcohol  / Substance Use: Not Applicable Psych Involvement: No (comment)  Admission diagnosis:  Hypokalemia [E87.6] Hyponatremia [E87.1] Hypoxia [R09.02] COPD exacerbation (HCC) [J44.1] Patient Active Problem List   Diagnosis Date Noted   Hypomagnesemia 09/18/2024   Essential hypertension 09/18/2024   COVID-19 virus infection 07/22/2021   Hyponatremia 10/17/2018   Hypokalemia 10/17/2018   CIGARETTE SMOKER 09/19/2009   COPD with acute exacerbation (HCC) 09/19/2009   BACK PAIN, LUMBAR 09/19/2009   ELEVATED BLOOD PRESSURE 09/13/2008   GERD 09/12/2008   Headache 09/12/2008   Hemorrhoids 08/13/2007   Anxiety state 08/12/2007   DEPRESSION 08/12/2007   Osteoarthritis 08/12/2007   Osteoporosis 08/12/2007   PCP:  Wonda Worth SQUIBB, PA Pharmacy:   [facility] [address], [place] - [address] [place] [address] Phone: [phone] Fax: [phone]  [place] [facility] [address] [place] [address] Phone: [phone] Fax: [phone]     Social Drivers of Health (SDOH) Social History: SDOH Screenings   Food Insecurity: No Food Insecurity (09/19/2024)  Housing: Low Risk (09/19/2024)  Transportation Needs: No Transportation Needs (09/19/2024)  Utilities: Not At Risk (09/19/2024)  Social Connections: Moderately Isolated (09/19/2024)  Tobacco Use: High Risk (09/18/2024)   SDOH Interventions:     Readmission Risk Interventions    09/20/2024    9:25  AM  Readmission Risk Prevention Plan  Post Dischage Appt Complete  Medication Screening Complete  Transportation Screening Complete

## 2024-09-20 NOTE — Progress Notes (Signed)
 Discharge medications delivered to patient at the bedside in a secure bag.

## 2024-09-21 LAB — CULTURE, RESPIRATORY W GRAM STAIN: Culture: NORMAL

## 2024-09-23 LAB — CULTURE, BLOOD (ROUTINE X 2)
Culture: NO GROWTH
Culture: NO GROWTH
Special Requests: ADEQUATE
Special Requests: ADEQUATE
